# Patient Record
Sex: Female | Born: 1955 | Race: White | Hispanic: No | Marital: Married | Smoking: Never smoker
Health system: Southern US, Community
[De-identification: ages and names within clinical notes are randomized; demographics above are authoritative.]

## PROBLEM LIST (undated history)

## (undated) DIAGNOSIS — N189 Chronic kidney disease, unspecified: Secondary | ICD-10-CM

## (undated) DIAGNOSIS — E119 Type 2 diabetes mellitus without complications: Secondary | ICD-10-CM

## (undated) DIAGNOSIS — E785 Hyperlipidemia, unspecified: Secondary | ICD-10-CM

## (undated) DIAGNOSIS — B009 Herpesviral infection, unspecified: Secondary | ICD-10-CM

## (undated) DIAGNOSIS — I10 Essential (primary) hypertension: Secondary | ICD-10-CM

## (undated) HISTORY — PX: CHOLECYSTECTOMY: SHX55

## (undated) HISTORY — DX: Type 2 diabetes mellitus without complications: E11.9

## (undated) HISTORY — DX: Chronic kidney disease, unspecified: N18.9

---

## 2004-11-21 ENCOUNTER — Encounter: Admission: RE | Admit: 2004-11-21 | Discharge: 2004-11-21 | Payer: Self-pay | Admitting: Family Medicine

## 2008-01-25 ENCOUNTER — Ambulatory Visit: Payer: Self-pay | Admitting: General Practice

## 2009-01-30 ENCOUNTER — Ambulatory Visit: Payer: Self-pay | Admitting: General Practice

## 2010-01-22 ENCOUNTER — Ambulatory Visit: Payer: Self-pay | Admitting: General Practice

## 2010-02-05 ENCOUNTER — Ambulatory Visit: Payer: Self-pay | Admitting: General Practice

## 2011-01-21 ENCOUNTER — Ambulatory Visit: Payer: Self-pay | Admitting: General Practice

## 2012-01-20 ENCOUNTER — Ambulatory Visit: Payer: Self-pay

## 2013-01-26 ENCOUNTER — Ambulatory Visit: Payer: Self-pay | Admitting: General Practice

## 2013-02-07 ENCOUNTER — Ambulatory Visit (INDEPENDENT_AMBULATORY_CARE_PROVIDER_SITE_OTHER): Payer: PRIVATE HEALTH INSURANCE | Admitting: Family Medicine

## 2013-02-07 VITALS — BP 106/60 | HR 88 | Temp 98.7°F | Resp 20 | Ht 64.75 in | Wt 186.2 lb

## 2013-02-07 DIAGNOSIS — R300 Dysuria: Secondary | ICD-10-CM

## 2013-02-07 DIAGNOSIS — E119 Type 2 diabetes mellitus without complications: Secondary | ICD-10-CM

## 2013-02-07 DIAGNOSIS — E1122 Type 2 diabetes mellitus with diabetic chronic kidney disease: Secondary | ICD-10-CM | POA: Insufficient documentation

## 2013-02-07 DIAGNOSIS — R3129 Other microscopic hematuria: Secondary | ICD-10-CM

## 2013-02-07 LAB — BASIC METABOLIC PANEL
BUN: 26 mg/dL — ABNORMAL HIGH (ref 6–23)
Calcium: 10.2 mg/dL (ref 8.4–10.5)
Creat: 1.08 mg/dL (ref 0.50–1.10)
Sodium: 143 mEq/L (ref 135–145)

## 2013-02-07 LAB — POCT URINALYSIS DIPSTICK: Urobilinogen, UA: 0.2

## 2013-02-07 LAB — POCT CBC
Granulocyte percent: 71.9 %G (ref 37–80)
HCT, POC: 45.8 % (ref 37.7–47.9)
Hemoglobin: 14.3 g/dL (ref 12.2–16.2)
Lymph, poc: 1.9 (ref 0.6–3.4)
MCH, POC: 29.8 pg (ref 27–31.2)
MCV: 95.4 fL (ref 80–97)
MID (cbc): 0.4 (ref 0–0.9)
MPV: 8.6 fL (ref 0–99.8)
POC Granulocyte: 5.9 (ref 2–6.9)
POC LYMPH PERCENT: 23.7 %L (ref 10–50)
POC MID %: 4.4 %M (ref 0–12)
WBC: 8.2 10*3/uL (ref 4.6–10.2)

## 2013-02-07 LAB — POCT UA - MICROSCOPIC ONLY: Casts, Ur, LPF, POC: NEGATIVE

## 2013-02-07 LAB — POCT GLYCOSYLATED HEMOGLOBIN (HGB A1C): Hemoglobin A1C: 5.2

## 2013-02-07 MED ORDER — CEPHALEXIN 500 MG PO CAPS: 500.0000 mg | ORAL_CAPSULE | Freq: Two times a day (BID) | ORAL | Status: DC

## 2013-02-07 NOTE — Patient Instructions (Addendum)
Use azo (pyuridium) OTC for your symptoms.    I will be in touch with your urine culture results.    Use the keflex as directed for your UTI Drink plenty of fluids.

## 2013-02-07 NOTE — Progress Notes (Addendum)
[facility] [address] [place] [address] [phone]  Date:  02/07/2013   Name:  Vanessa Sandoval   DOB:  06-10-1955   MRN:  [id number]  PCP:  Karie Chimera, MD    Chief Complaint: Cystitis   History of Present Illness:  Vanessa Sandoval is a 57 y.o. very pleasant female patient who presents with the following:  She awoke with suprapubic pain this morning.  She has noted urinary frequency and urgency.   She does not have any hematuria or dysuria.   She has had a bladder infection in the past- this this may be the same.    No back pain, no fever, no vomiting.   She has not noted any vaginal symptoms.    She has IDDM, and takes zocor and ?amlodipine for BP.    NKDA.   She does not generally check her glucose.  Her labs were checked a few months ago- sometime over the summer.  Reports her A1c was in the 5s at that time  There are no active problems to display for this patient.   Past Medical History  Diagnosis Date  . Diabetes mellitus without complication     Past Surgical History  Procedure Laterality Date  . Cesarean section    . Cholecystectomy      History  Substance Use Topics  . Smoking status: Never Smoker   . Smokeless tobacco: Not on file  . Alcohol Use: No    Family History  Problem Relation Age of Onset  . Diabetes Mother   . Hyperlipidemia Mother   . Diabetes Father   . Heart disease Father   . Parkinson's disease Father   . Diabetes Brother     No Known Allergies  Medication list has been reviewed and updated.  No current outpatient prescriptions on file prior to visit.   No current facility-administered medications on file prior to visit.    Review of Systems:  As per HPI- otherwise negative. History of cholecyctectomy.  No other abdominal operations  Physical Examination: Filed Vitals:   02/07/13 1252  BP: 106/60  Pulse: 88  Temp: 98.7 F (37.1 C)  Resp: 20   Filed Vitals:   02/07/13 1252  Height:  5' 4.75" (1.645 m)  Weight: 186 lb 3.2 oz (84.46 kg)   Body mass index is 31.21 kg/(m^2). Ideal Body Weight: Weight in (lb) to have BMI = 25: 148.8  GEN: WDWN, NAD, Non-toxic, A & O x 3, overweight, looks well HEENT: Atraumatic, Normocephalic. Neck supple. No masses, No LAD. Ears and Nose: No external deformity. CV: RRR, No M/G/R. No JVD. No thrill. No extra heart sounds. PULM: CTA B, no wheezes, crackles, rhonchi. No retractions. No resp. distress. No accessory muscle use. ABD: S, ND, +BS. No rebound. No HSM.  Mild suprapubic tenderness  EXTR: No c/c/e NEURO Normal gait.  PSYCH: Normally interactive. Conversant. Not depressed or anxious appearing.  Calm demeanor.   Results for orders placed in visit on 02/07/13  POCT UA - MICROSCOPIC ONLY      Result Value Range   WBC, Ur, HPF, POC 4-8     RBC, urine, microscopic tntc     Bacteria, U Microscopic 1+     Mucus, UA small     Epithelial cells, urine per micros 3-6     Crystals, Ur, HPF, POC neg     Casts, Ur, LPF, POC neg     Yeast, UA neg  POCT URINALYSIS DIPSTICK      Result Value Range   Color, UA yellow     Clarity, UA clear     Glucose, UA neg     Bilirubin, UA moderate     Ketones, UA trace     Spec Grav, UA >=1.030     Blood, UA large     pH, UA 5.5     Protein, UA 100     Urobilinogen, UA 0.2     Nitrite, UA neg     Leukocytes, UA small (1+)    POCT CBC      Result Value Range   WBC 8.2  4.6 - 10.2 K/uL   Lymph, poc 1.9  0.6 - 3.4   POC LYMPH PERCENT 23.7  10 - 50 %L   MID (cbc) 0.4  0 - 0.9   POC MID % 4.4  0 - 12 %M   POC Granulocyte 5.9  2 - 6.9   Granulocyte percent 71.9  37 - 80 %G   RBC 4.80  4.04 - 5.48 M/uL   Hemoglobin 14.3  12.2 - 16.2 g/dL   HCT, POC 16.1  09.6 - 47.9 %   MCV 95.4  80 - 97 fL   MCH, POC 29.8  27 - 31.2 pg   MCHC 31.2 (*) 31.8 - 35.4 g/dL   RDW, POC 04.5     Platelet Count, POC 309  142 - 424 K/uL   MPV 8.6  0 - 99.8 fL  POCT GLYCOSYLATED HEMOGLOBIN (HGB A1C)      Result  Value Range   Hemoglobin A1C 5.2        Assessment and Plan: Dysuria - Plan: POCT UA - Microscopic Only, POCT urinalysis dipstick, Urine culture, cephALEXin (KEFLEX) 500 MG capsule  Type II or unspecified type diabetes mellitus without mention of complication, not stated as uncontrolled - Plan: Basic metabolic panel, POCT CBC, POCT glycosylated hemoglobin (Hb A1C)  IDDM (insulin dependent diabetes mellitus)  likely UTI.  Start keflex, await urine culture.   DM: continues to show good control.  Await BMP.   See patient instructions for more details.     Signed Abbe Amsterdam, MD  11/15: received urine culture.  Results negative.  Called but no answer, left detailed message Asked her to please give me a call if not feeing better.  Otherwise need to evaluate hematuria via urology.  I will make this referral for her.  a

## 2013-02-11 ENCOUNTER — Encounter: Payer: Self-pay | Admitting: Family Medicine

## 2013-02-11 NOTE — Addendum Note (Signed)
Addended by: Abbe Amsterdam C on: 02/11/2013 01:22 PM   Modules accepted: Orders

## 2013-05-07 ENCOUNTER — Other Ambulatory Visit: Payer: Self-pay | Admitting: Family Medicine

## 2014-01-23 ENCOUNTER — Ambulatory Visit: Payer: Self-pay | Admitting: General Practice

## 2014-12-24 ENCOUNTER — Other Ambulatory Visit: Payer: Self-pay | Admitting: Family Medicine

## 2014-12-26 LAB — CMP12+LP+TP+TSH+6AC+CBC/D/PLT
ALK PHOS: 81 IU/L (ref 39–117)
ALT: 14 IU/L (ref 0–32)
AST: 14 IU/L (ref 0–40)
Albumin/Globulin Ratio: 1.3 (ref 1.1–2.5)
Albumin: 4.3 g/dL (ref 3.5–5.5)
BASOS ABS: 0 10*3/uL (ref 0.0–0.2)
BILIRUBIN TOTAL: 0.3 mg/dL (ref 0.0–1.2)
BUN/Creatinine Ratio: 28 — ABNORMAL HIGH (ref 9–23)
BUN: 34 mg/dL — ABNORMAL HIGH (ref 6–24)
Basos: 1 %
CALCIUM: 9.9 mg/dL (ref 8.7–10.2)
CHLORIDE: 99 mmol/L (ref 97–108)
CHOL/HDL RATIO: 4.1 ratio (ref 0.0–4.4)
CHOLESTEROL TOTAL: 203 mg/dL — AB (ref 100–199)
Creatinine, Ser: 1.23 mg/dL — ABNORMAL HIGH (ref 0.57–1.00)
EOS (ABSOLUTE): 0.1 10*3/uL (ref 0.0–0.4)
Eos: 2 %
Estimated CHD Risk: 0.9 times avg. (ref 0.0–1.0)
FREE THYROXINE INDEX: 2.1 (ref 1.2–4.9)
GFR calc non Af Amer: 48 mL/min/{1.73_m2} — ABNORMAL LOW (ref >59)
GFR, EST AFRICAN AMERICAN: 56 mL/min/{1.73_m2} — AB (ref >59)
GGT: 12 IU/L (ref 0–60)
GLUCOSE: 128 mg/dL — AB (ref 65–99)
Globulin, Total: 3.2 g/dL (ref 1.5–4.5)
HDL: 50 mg/dL (ref >39)
Hematocrit: 41.4 % (ref 34.0–46.6)
Hemoglobin: 13.4 g/dL (ref 11.1–15.9)
IMMATURE GRANS (ABS): 0 10*3/uL (ref 0.0–0.1)
IMMATURE GRANULOCYTES: 0 %
Iron: 91 ug/dL (ref 27–159)
LDH: 204 IU/L (ref 119–226)
LDL CALC: 124 mg/dL — AB (ref 0–99)
LYMPHS: 38 %
Lymphocytes Absolute: 2.6 10*3/uL (ref 0.7–3.1)
MCH: 29.2 pg (ref 26.6–33.0)
MCHC: 32.4 g/dL (ref 31.5–35.7)
MCV: 90 fL (ref 79–97)
MONOCYTES: 8 %
MONOS ABS: 0.5 10*3/uL (ref 0.1–0.9)
NEUTROS ABS: 3.6 10*3/uL (ref 1.4–7.0)
NEUTROS PCT: 51 %
PHOSPHORUS: 4 mg/dL (ref 2.5–4.5)
Platelets: 309 10*3/uL (ref 150–379)
Potassium: 4 mmol/L (ref 3.5–5.2)
RBC: 4.59 x10E6/uL (ref 3.77–5.28)
RDW: 14.3 % (ref 12.3–15.4)
Sodium: 142 mmol/L (ref 134–144)
T3 Uptake Ratio: 24 % (ref 24–39)
T4 TOTAL: 8.8 ug/dL (ref 4.5–12.0)
TOTAL PROTEIN: 7.5 g/dL (ref 6.0–8.5)
TRIGLYCERIDES: 143 mg/dL (ref 0–149)
TSH: 3.19 u[IU]/mL (ref 0.450–4.500)
Uric Acid: 8.9 mg/dL — ABNORMAL HIGH (ref 2.5–7.1)
VLDL Cholesterol Cal: 29 mg/dL (ref 5–40)
WBC: 6.9 10*3/uL (ref 3.4–10.8)

## 2014-12-26 LAB — HGB A1C W/O EAG: Hgb A1c MFr Bld: 7.1 % — ABNORMAL HIGH (ref 4.8–5.6)

## 2015-01-09 ENCOUNTER — Other Ambulatory Visit: Payer: Self-pay | Admitting: Family Medicine

## 2015-01-09 DIAGNOSIS — Z1231 Encounter for screening mammogram for malignant neoplasm of breast: Secondary | ICD-10-CM

## 2015-01-15 ENCOUNTER — Ambulatory Visit
Admission: RE | Admit: 2015-01-15 | Discharge: 2015-01-15 | Disposition: A | Discharge status: Home / Self Care | Payer: No Typology Code available for payment source | Source: Ambulatory Visit | Attending: Family Medicine | Admitting: Family Medicine

## 2015-01-15 DIAGNOSIS — Z1231 Encounter for screening mammogram for malignant neoplasm of breast: Secondary | ICD-10-CM | POA: Insufficient documentation

## 2016-01-03 ENCOUNTER — Other Ambulatory Visit: Payer: Self-pay | Admitting: Family Medicine

## 2016-01-03 DIAGNOSIS — Z1231 Encounter for screening mammogram for malignant neoplasm of breast: Secondary | ICD-10-CM

## 2016-01-04 LAB — CMP12+LP+TP+TSH+6AC+CBC/D/PLT
ALBUMIN: 4.1 g/dL (ref 3.6–4.8)
ALK PHOS: 104 IU/L (ref 39–117)
ALT: 14 IU/L (ref 0–32)
AST: 17 IU/L (ref 0–40)
Albumin/Globulin Ratio: 1.3 (ref 1.2–2.2)
BUN/Creatinine Ratio: 27 (ref 12–28)
BUN: 32 mg/dL — ABNORMAL HIGH (ref 8–27)
Basophils Absolute: 0 10*3/uL (ref 0.0–0.2)
Basos: 0 %
Bilirubin Total: <0.2 mg/dL (ref 0.0–1.2)
CALCIUM: 8.9 mg/dL (ref 8.7–10.3)
CHOLESTEROL TOTAL: 193 mg/dL (ref 100–199)
Chloride: 103 mmol/L (ref 96–106)
Chol/HDL Ratio: 4.7 ratio units — ABNORMAL HIGH (ref 0.0–4.4)
Creatinine, Ser: 1.2 mg/dL — ABNORMAL HIGH (ref 0.57–1.00)
EOS (ABSOLUTE): 0.1 10*3/uL (ref 0.0–0.4)
Eos: 3 %
Estimated CHD Risk: 1.2 times avg. — ABNORMAL HIGH (ref 0.0–1.0)
FREE THYROXINE INDEX: 2 (ref 1.2–4.9)
GFR calc Af Amer: 57 mL/min/{1.73_m2} — ABNORMAL LOW (ref >59)
GFR calc non Af Amer: 49 mL/min/{1.73_m2} — ABNORMAL LOW (ref >59)
GGT: 12 IU/L (ref 0–60)
GLOBULIN, TOTAL: 3.1 g/dL (ref 1.5–4.5)
Glucose: 169 mg/dL — ABNORMAL HIGH (ref 65–99)
HDL: 41 mg/dL (ref >39)
Hematocrit: 40.4 % (ref 34.0–46.6)
Hemoglobin: 13.8 g/dL (ref 11.1–15.9)
IMMATURE GRANS (ABS): 0 10*3/uL (ref 0.0–0.1)
IMMATURE GRANULOCYTES: 0 %
Iron: 83 ug/dL (ref 27–159)
LDH: 203 IU/L (ref 119–226)
LDL CALC: 109 mg/dL — AB (ref 0–99)
LYMPHS: 41 %
Lymphocytes Absolute: 2.2 10*3/uL (ref 0.7–3.1)
MCH: 30.7 pg (ref 26.6–33.0)
MCHC: 34.2 g/dL (ref 31.5–35.7)
MCV: 90 fL (ref 79–97)
MONOS ABS: 0.5 10*3/uL (ref 0.1–0.9)
Monocytes: 9 %
NEUTROS PCT: 47 %
Neutrophils Absolute: 2.6 10*3/uL (ref 1.4–7.0)
PLATELETS: 248 10*3/uL (ref 150–379)
POTASSIUM: 3.7 mmol/L (ref 3.5–5.2)
Phosphorus: 2.6 mg/dL (ref 2.5–4.5)
RBC: 4.49 x10E6/uL (ref 3.77–5.28)
RDW: 13.6 % (ref 12.3–15.4)
Sodium: 142 mmol/L (ref 134–144)
T3 UPTAKE RATIO: 26 % (ref 24–39)
T4 TOTAL: 7.6 ug/dL (ref 4.5–12.0)
TRIGLYCERIDES: 216 mg/dL — AB (ref 0–149)
TSH: 2.6 u[IU]/mL (ref 0.450–4.500)
Total Protein: 7.2 g/dL (ref 6.0–8.5)
Uric Acid: 9.2 mg/dL — ABNORMAL HIGH (ref 2.5–7.1)
VLDL Cholesterol Cal: 43 mg/dL — ABNORMAL HIGH (ref 5–40)
WBC: 5.4 10*3/uL (ref 3.4–10.8)

## 2016-01-04 LAB — HGB A1C W/O EAG: Hgb A1c MFr Bld: 6.9 % — ABNORMAL HIGH (ref 4.8–5.6)

## 2016-01-20 ENCOUNTER — Ambulatory Visit
Admission: RE | Admit: 2016-01-20 | Discharge: 2016-01-20 | Disposition: A | Discharge status: Home / Self Care | Payer: No Typology Code available for payment source | Source: Ambulatory Visit | Attending: Family Medicine | Admitting: Family Medicine

## 2016-01-20 DIAGNOSIS — R928 Other abnormal and inconclusive findings on diagnostic imaging of breast: Secondary | ICD-10-CM | POA: Diagnosis not present

## 2016-01-20 DIAGNOSIS — Z1231 Encounter for screening mammogram for malignant neoplasm of breast: Secondary | ICD-10-CM | POA: Diagnosis not present

## 2016-01-23 ENCOUNTER — Other Ambulatory Visit: Payer: Self-pay | Admitting: Family Medicine

## 2016-01-23 DIAGNOSIS — N6310 Unspecified lump in the right breast, unspecified quadrant: Secondary | ICD-10-CM

## 2016-02-06 ENCOUNTER — Ambulatory Visit
Admission: RE | Admit: 2016-02-06 | Discharge: 2016-02-06 | Disposition: A | Discharge status: Home / Self Care | Payer: No Typology Code available for payment source | Source: Ambulatory Visit | Attending: Family Medicine | Admitting: Family Medicine

## 2016-02-06 DIAGNOSIS — N6310 Unspecified lump in the right breast, unspecified quadrant: Secondary | ICD-10-CM

## 2016-02-16 ENCOUNTER — Ambulatory Visit (HOSPITAL_COMMUNITY)
Admission: EM | Admit: 2016-02-16 | Discharge: 2016-02-16 | Disposition: A | Discharge status: Home / Self Care | Payer: No Typology Code available for payment source | Attending: Emergency Medicine | Admitting: Emergency Medicine

## 2016-02-16 ENCOUNTER — Encounter (HOSPITAL_COMMUNITY): Payer: Self-pay | Admitting: *Deleted

## 2016-02-16 DIAGNOSIS — M62830 Muscle spasm of back: Secondary | ICD-10-CM

## 2016-02-16 HISTORY — DX: Essential (primary) hypertension: I10

## 2016-02-16 HISTORY — DX: Hyperlipidemia, unspecified: E78.5

## 2016-02-16 MED ORDER — CYCLOBENZAPRINE HCL 5 MG PO TABS: 5.0000 mg | ORAL_TABLET | Freq: Three times a day (TID) | ORAL | 0 refills | Status: DC | PRN

## 2016-02-16 NOTE — ED Provider Notes (Signed)
[facility]    CSN: [id number] Arrival date & time: 02/16/16  1210     History   Chief Complaint Chief Complaint  Patient presents with  . Back Pain    HPI Vanessa Sandoval is a 60 y.o. female.   HPI She is a 60-year-old woman here for evaluation of back pain. She reports pain in the left upper back. This started on Thursday. She describes it as intermittent spasms. Movement seems to trigger them. Heat makes it better. She denies any radiating pain, numbness, or weakness. No cough or shortness of breath. Pain is not related to breathing. No fevers.  Past Medical History:  Diagnosis Date  . Diabetes mellitus without complication (HCC)   . Hyperlipemia   . Hypertension     Patient Active Problem List   Diagnosis Date Noted  . IDDM (insulin dependent diabetes mellitus) (HCC) 02/07/2013    Past Surgical History:  Procedure Laterality Date  . CESAREAN SECTION    . CHOLECYSTECTOMY      OB History    No data available       Home Medications    Prior to Admission medications   Medication Sig Start Date End Date Taking? Authorizing Provider  triamterene-hydrochlorothiazide (DYAZIDE) 37.5-25 MG capsule Take 1 capsule by mouth daily.   Yes Historical Provider, MD  cyclobenzaprine (FLEXERIL) 5 MG tablet Take 1 tablet (5 mg total) by mouth 3 (three) times daily as needed for muscle spasms. 02/16/16   Charm RingsErin J Kayla Weekes, MD  insulin glargine (LANTUS) 100 UNIT/ML injection Inject 80 Units into the skin at bedtime.     Historical Provider, MD  simvastatin (ZOCOR) 5 MG tablet Take 40 mg by mouth daily.     Historical Provider, MD    Family History Family History  Problem Relation Age of Onset  . Diabetes Mother   . Hyperlipidemia Mother   . Diabetes Father   . Heart disease Father   . Parkinson's disease Father   . Diabetes Brother   . Breast cancer Neg Hx     Social History Social History  Substance Use Topics  . Smoking status: Passive Smoke Exposure - Never  Smoker  . Smokeless tobacco: Never Used  . Alcohol use No     Allergies   Patient has no known allergies.   Review of Systems Review of Systems As in history of present illness  Physical Exam Triage Vital Signs ED Triage Vitals  Enc Vitals Group     BP 02/16/16 1304 144/82     Pulse Rate 02/16/16 1304 74     Resp 02/16/16 1304 16     Temp 02/16/16 1304 98.3 F (36.8 C)     Temp Source 02/16/16 1304 Oral     SpO2 02/16/16 1304 100 %     Weight --      Height --      Head Circumference --      Peak Flow --      Pain Score 02/16/16 1307 0     Pain Loc --      Pain Edu? --      Excl. in GC? --    No data found.   Updated Vital Signs BP 144/82 (BP Location: Left Arm)   Pulse 74   Temp 98.3 F (36.8 C) (Oral)   Resp 16   SpO2 100%   Visual Acuity Right Eye Distance:   Left Eye Distance:   Bilateral Distance:    Right Eye Near:  Left Eye Near:    Bilateral Near:     Physical Exam  Constitutional: She is oriented to person, place, and time. She appears well-developed and well-nourished. No distress.  Cardiovascular: Normal rate.   Pulmonary/Chest: Effort normal.  Musculoskeletal:  Back: No vertebral tenderness or step-offs. She appears to have a knotted muscle along the medial aspect of the left scapula. She is tender over this as well as more medially. 5 out of 5 strength in bilateral upper extremities.  Neurological: She is alert and oriented to person, place, and time.     UC Treatments / Results  Labs (all labs ordered are listed, but only abnormal results are displayed) Labs Reviewed - No data to display  EKG  EKG Interpretation None       Radiology No results found.  Procedures Procedures (including critical care time)  Medications Ordered in UC Medications - No data to display   Initial Impression / Assessment and Plan / UC Course  I have reviewed the triage vital signs and the nursing notes.  Pertinent labs & imaging results  that were available during my care of the patient were reviewed by me and considered in my medical decision making (see chart for details).  Clinical Course     History and exam consistent with muscle spasms. We'll treat with Flexeril as needed. Recommended continued heat and adding gentle massage. Follow-up as needed.  Final Clinical Impressions(s) / UC Diagnoses   Final diagnoses:  Back spasm    New Prescriptions Discharge Medication List as of 02/16/2016  2:12 PM    START taking these medications   Details  cyclobenzaprine (FLEXERIL) 5 MG tablet Take 1 tablet (5 mg total) by mouth 3 (three) times daily as needed for muscle spasms., Starting Sun 02/16/2016, Normal         Charm RingsErin J Vanessa Want, MD 02/16/16 1425

## 2016-02-16 NOTE — Discharge Instructions (Signed)
You are having muscle spasms of your back. Take Flexeril 3 times a day as needed. Do not drive while taking this medicine. Continue to apply heat. When things calm down a little bit, have a family member do massage. Follow-up as needed.

## 2016-02-16 NOTE — ED Triage Notes (Signed)
Denies any injury.  No relief with OTC meds.

## 2016-03-19 ENCOUNTER — Encounter (HOSPITAL_COMMUNITY): Payer: Self-pay | Admitting: Emergency Medicine

## 2016-03-19 ENCOUNTER — Ambulatory Visit (HOSPITAL_COMMUNITY)
Admission: EM | Admit: 2016-03-19 | Discharge: 2016-03-19 | Disposition: A | Discharge status: Home / Self Care | Payer: No Typology Code available for payment source | Attending: Emergency Medicine | Admitting: Emergency Medicine

## 2016-03-19 DIAGNOSIS — H9202 Otalgia, left ear: Secondary | ICD-10-CM | POA: Diagnosis not present

## 2016-03-19 DIAGNOSIS — J069 Acute upper respiratory infection, unspecified: Secondary | ICD-10-CM | POA: Diagnosis not present

## 2016-03-19 DIAGNOSIS — H66012 Acute suppurative otitis media with spontaneous rupture of ear drum, left ear: Secondary | ICD-10-CM

## 2016-03-19 DIAGNOSIS — B9789 Other viral agents as the cause of diseases classified elsewhere: Secondary | ICD-10-CM | POA: Diagnosis not present

## 2016-03-19 MED ORDER — AMOXICILLIN 500 MG PO CAPS: 1000.0000 mg | ORAL_CAPSULE | Freq: Two times a day (BID) | ORAL | 0 refills | Status: DC

## 2016-03-19 NOTE — ED Provider Notes (Signed)
CSN: [id number]     Arrival date & time 03/19/16  1010 History   First MD Initiated Contact with Patient 03/19/16 1114     Chief Complaint  Patient presents with  . Otalgia   (Consider location/radiation/quality/duration/timing/severity/associated sxs/prior Treatment) 60 year old female complaining of decreased hearing in the left ear. She initially developed pain yesterday afternoon and then yesterday evening she noticed some clear drainage from the left ear canal. The pain was partially relieved. The ear feels better now. She has had some mild upper respiratory congestion over the past several days.      Past Medical History:  Diagnosis Date  . Diabetes mellitus without complication (HCC)   . Hyperlipemia   . Hypertension    Past Surgical History:  Procedure Laterality Date  . CESAREAN SECTION    . CHOLECYSTECTOMY     Family History  Problem Relation Age of Onset  . Diabetes Mother   . Hyperlipidemia Mother   . Diabetes Father   . Heart disease Father   . Parkinson's disease Father   . Diabetes Brother   . Breast cancer Neg Hx    Social History  Substance Use Topics  . Smoking status: Passive Smoke Exposure - Never Smoker  . Smokeless tobacco: Never Used  . Alcohol use No   OB History    No data available     Review of Systems  Constitutional: Negative for activity change, appetite change, chills, fatigue and fever.  HENT: Positive for congestion, ear pain, postnasal drip and rhinorrhea. Negative for facial swelling.   Eyes: Negative.   Respiratory: Negative.   Cardiovascular: Negative.   Musculoskeletal: Negative for neck pain and neck stiffness.  Skin: Negative for pallor and rash.  Neurological: Negative.   All other systems reviewed and are negative.   Allergies  Patient has no known allergies.  Home Medications   Prior to Admission medications   Medication Sig Start Date End Date Taking? Authorizing Provider  amoxicillin (AMOXIL) 500 MG capsule  Take 2 capsules (1,000 mg total) by mouth 2 (two) times daily. 03/19/16   Hayden Rasmussenavid Taiga Lupinacci, NP  cyclobenzaprine (FLEXERIL) 5 MG tablet Take 1 tablet (5 mg total) by mouth 3 (three) times daily as needed for muscle spasms. 02/16/16   Charm RingsErin J Honig, MD  insulin glargine (LANTUS) 100 UNIT/ML injection Inject 80 Units into the skin at bedtime.     Historical Provider, MD  simvastatin (ZOCOR) 5 MG tablet Take 40 mg by mouth daily.     Historical Provider, MD  triamterene-hydrochlorothiazide (DYAZIDE) 37.5-25 MG capsule Take 1 capsule by mouth daily.    Historical Provider, MD   Meds Ordered and Administered this Visit  Medications - No data to display  BP 125/60 (BP Location: Left Arm)   Pulse 81   Temp 98.7 F (37.1 C) (Oral)   Resp 18   SpO2 96%  No data found.   Physical Exam  Constitutional: She is oriented to person, place, and time. She appears well-developed and well-nourished. No distress.  HENT:  Head: Normocephalic and atraumatic.  Mouth/Throat: No oropharyngeal exudate.  Right TM retracted with minor erythema. Left TM deeply erythematous with a large tense bulge in the lower half. Currently no fluid or noticed drainage in the EAC. No blood.  Oropharynx with minor erythema and clear PND.  Neck: Normal range of motion. Neck supple.  Cardiovascular: Normal rate and regular rhythm.   Pulmonary/Chest: Effort normal and breath sounds normal. No respiratory distress. She has no wheezes. She has  no rales.  Musculoskeletal: Normal range of motion. She exhibits no edema.  Lymphadenopathy:    She has no cervical adenopathy.  Neurological: She is alert and oriented to person, place, and time.  Skin: Skin is warm and dry. No rash noted.  Psychiatric: She has a normal mood and affect.  Nursing note and vitals reviewed.   Urgent Care Course   Clinical Course     Procedures (including critical care time)  Labs Review Labs Reviewed - No data to display  Imaging Review No results  found.   Visual Acuity Review  Right Eye Distance:   Left Eye Distance:   Bilateral Distance:    Right Eye Near:   Left Eye Near:    Bilateral Near:         MDM   1. Acute suppurative otitis media of left ear with spontaneous rupture of tympanic membrane, recurrence not specified   2. Otalgia of left ear   3. Viral upper respiratory tract infection    Decongestants as directed. He should drink any fluids stay well-hydrated. May also want to take an antihistamine such as Claritin to help with drainage and runny nose. Antibiotics hysterectomy. For increased pain or worsening of your ear over the next few days despite being on antibiotics follow-up with your primary care doctor. Meds ordered this encounter  Medications  . amoxicillin (AMOXIL) 500 MG capsule    Sig: Take 2 capsules (1,000 mg total) by mouth 2 (two) times daily.    Dispense:  40 capsule    Refill:  0    Order Specific Question:   Supervising Provider    Answer:   Charm RingsHONIG, ERIN J [[id number]]       Hayden Rasmussenavid Aeralyn Barna, NP 03/19/16 1133

## 2016-03-19 NOTE — ED Triage Notes (Signed)
Reports upper respiratory symptoms for a week.  Last night left ear had pain, draining liquid and cannot hear in this ear.  Patient says it was clear liquid drainage from left ear.

## 2016-03-19 NOTE — Discharge Instructions (Signed)
Decongestants as directed. He should drink any fluids stay well-hydrated. May also want to take an antihistamine such as Claritin to help with drainage and runny nose. Antibiotics hysterectomy. For increased pain or worsening of your ear over the next few days despite being on antibiotics follow-up with your primary care doctor.

## 2016-08-19 ENCOUNTER — Encounter (HOSPITAL_COMMUNITY): Payer: Self-pay | Admitting: Emergency Medicine

## 2016-08-19 ENCOUNTER — Ambulatory Visit (HOSPITAL_COMMUNITY)
Admission: EM | Admit: 2016-08-19 | Discharge: 2016-08-19 | Disposition: A | Discharge status: Home / Self Care | Payer: No Typology Code available for payment source | Attending: Family Medicine | Admitting: Family Medicine

## 2016-08-19 DIAGNOSIS — B373 Candidiasis of vulva and vagina: Secondary | ICD-10-CM

## 2016-08-19 DIAGNOSIS — N898 Other specified noninflammatory disorders of vagina: Secondary | ICD-10-CM | POA: Diagnosis not present

## 2016-08-19 DIAGNOSIS — B3731 Acute candidiasis of vulva and vagina: Secondary | ICD-10-CM

## 2016-08-19 HISTORY — DX: Herpesviral infection, unspecified: B00.9

## 2016-08-19 MED ORDER — FLUCONAZOLE 150 MG PO TABS: 150.0000 mg | ORAL_TABLET | Freq: Every day | ORAL | 1 refills | Status: DC

## 2016-08-19 NOTE — Medical Student Note (Signed)
[facility] [employer] account manager Note For educational purposes for Medical, PA and NP students only and not part of the legal medical record.   CSN: [id number] Arrival date & time: 08/19/16  1624     History   Chief Complaint Chief Complaint  Patient presents with  . Vaginal Itching    HPI Vanessa Sandoval is a 61 y.o. female.  Pt is a 61 year old female that presents to the [facility] with vaginal itching x 3 weeks. Denies any specific discharge. Denies bleeding. Denies abd pain or pelvic pain. Denies urinary symptoms. sts that she has been using monistat with some relief.    The history is provided by the patient.  Vaginal Itching  This is a new problem. The current episode started more than 1 week ago. The problem occurs daily. The problem has not changed since onset.Nothing aggravates the symptoms. The symptoms are relieved by medications. Treatments tried: using monistsat with relief during the day.    Past Medical History:  Diagnosis Date  . Diabetes mellitus without complication (HCC)   . Herpes   . Hyperlipemia   . Hypertension     Patient Active Problem List   Diagnosis Date Noted  . IDDM (insulin dependent diabetes mellitus) (HCC) 02/07/2013    Past Surgical History:  Procedure Laterality Date  . CESAREAN SECTION    . CHOLECYSTECTOMY      OB History    No data available       Home Medications    Prior to Admission medications   Medication Sig Start Date End Date Taking? Authorizing Provider  insulin glargine (LANTUS) 100 UNIT/ML injection Inject 80 Units into the skin at bedtime.    Yes [provider]  simvastatin (ZOCOR) 5 MG tablet Take 40 mg by mouth daily.    Yes [provider]  triamterene-hydrochlorothiazide (DYAZIDE) 37.5-25 MG capsule Take 1 capsule by mouth daily.   Yes [provider]  amoxicillin (AMOXIL) 500 MG capsule Take 2 capsules (1,000 mg total) by mouth 2 (two) times daily. 03/19/16   Hayden Rasmussen, NP    cyclobenzaprine (FLEXERIL) 5 MG tablet Take 1 tablet (5 mg total) by mouth 3 (three) times daily as needed for muscle spasms. 02/16/16   Charm Rings, MD  fluconazole (DIFLUCAN) 150 MG tablet Take 1 tablet (150 mg total) by mouth daily. 08/19/16   Deatra Canter, FNP    Family History Family History  Problem Relation Age of Onset  . Diabetes Mother   . Hyperlipidemia Mother   . Diabetes Father   . Heart disease Father   . Parkinson's disease Father   . Diabetes Brother   . Breast cancer Neg Hx     Social History Social History  Substance Use Topics  . Smoking status: Passive Smoke Exposure - Never Smoker  . Smokeless tobacco: Never Used  . Alcohol use No     Allergies   Patient has no known allergies.   Review of Systems Review of Systems  Constitutional: Negative.   HENT: Negative.   Eyes: Negative.   Respiratory: Negative.   Cardiovascular: Negative.   Gastrointestinal: Negative.   Endocrine: Negative.   Genitourinary: Negative.  Negative for decreased urine volume, difficulty urinating, dysuria, frequency, genital sores, hematuria, pelvic pain, urgency, vaginal bleeding, vaginal discharge and vaginal pain.  Musculoskeletal: Negative.   Skin: Negative.   Allergic/Immunologic: Negative.   Neurological: Negative.   Hematological: Negative.   Psychiatric/Behavioral: Negative.      Physical  Exam Updated Vital Signs BP 133/63 (BP Location: Left Arm)   Pulse 90   Temp 98.5 F (36.9 C) (Oral)   SpO2 97%   Physical Exam  Constitutional: She is oriented to person, place, and time. She appears well-developed and well-nourished.  HENT:  Head: Normocephalic and atraumatic.  Eyes: Conjunctivae and EOM are normal. Pupils are equal, round, and reactive to light.  Neck: Normal range of motion. Neck supple.  Cardiovascular: Normal rate and regular rhythm.   Pulmonary/Chest: Effort normal and breath sounds normal.  Abdominal: Soft. Bowel sounds are normal.   Genitourinary: Vagina normal.  Musculoskeletal: Normal range of motion.  Neurological: She is alert and oriented to person, place, and time.  Skin: Skin is warm and dry. Capillary refill takes less than 2 seconds.  Psychiatric: She has a normal mood and affect.     ED Treatments / Results  Labs (all labs ordered are listed, but only abnormal results are displayed) Labs Reviewed - No data to display  EKG  EKG Interpretation None       Radiology No results found.  Procedures Procedures (including critical care time)  Medications Ordered in ED Medications - No data to display   Initial Impression / Assessment and Plan / ED Course  I have reviewed the triage vital signs and the nursing notes.  Pertinent labs & imaging results that were available during my care of the patient were reviewed by me and considered in my medical decision making (see chart for details).       Final Clinical Impressions(s) / ED Diagnoses   Final diagnoses:  Vaginal candidiasis    New Prescriptions New Prescriptions   FLUCONAZOLE (DIFLUCAN) 150 MG TABLET    Take 1 tablet (150 mg total) by mouth daily.

## 2016-08-19 NOTE — ED Provider Notes (Signed)
CSN: [id number]     Arrival date & time 08/19/16  1624 History   None    Chief Complaint  Patient presents with  . Vaginal Itching   (Consider location/radiation/quality/duration/timing/severity/associated sxs/prior Treatment)  Vaginal Itching     Past Medical History:  Diagnosis Date  . Diabetes mellitus without complication (HCC)   . Herpes   . Hyperlipemia   . Hypertension    Past Surgical History:  Procedure Laterality Date  . CESAREAN SECTION    . CHOLECYSTECTOMY     Family History  Problem Relation Age of Onset  . Diabetes Mother   . Hyperlipidemia Mother   . Diabetes Father   . Heart disease Father   . Parkinson's disease Father   . Diabetes Brother   . Breast cancer Neg Hx    Social History  Substance Use Topics  . Smoking status: Passive Smoke Exposure - Never Smoker  . Smokeless tobacco: Never Used  . Alcohol use No   OB History    No data available     Review of Systems  Constitutional: Negative.   HENT: Negative.   Eyes: Negative.   Respiratory: Negative.   Cardiovascular: Negative.   Gastrointestinal: Negative.   Endocrine: Negative.   Genitourinary: Negative.   Musculoskeletal: Negative.   Allergic/Immunologic: Negative.   Neurological: Negative.   Hematological: Negative.   Psychiatric/Behavioral: Negative.     Allergies  Patient has no known allergies.  Home Medications   Prior to Admission medications   Medication Sig Start Date End Date Taking? Authorizing Provider  insulin glargine (LANTUS) 100 UNIT/ML injection Inject 80 Units into the skin at bedtime.    Yes [provider]  simvastatin (ZOCOR) 5 MG tablet Take 40 mg by mouth daily.    Yes [provider]  triamterene-hydrochlorothiazide (DYAZIDE) 37.5-25 MG capsule Take 1 capsule by mouth daily.   Yes [provider]  amoxicillin (AMOXIL) 500 MG capsule Take 2 capsules (1,000 mg total) by mouth 2 (two) times daily. 03/19/16   Hayden RasmussenMabe, David, NP   cyclobenzaprine (FLEXERIL) 5 MG tablet Take 1 tablet (5 mg total) by mouth 3 (three) times daily as needed for muscle spasms. 02/16/16   Charm RingsHonig, Erin J, MD  fluconazole (DIFLUCAN) 150 MG tablet Take 1 tablet (150 mg total) by mouth daily. 08/19/16   Deatra Canterxford, William J, FNP   Meds Ordered and Administered this Visit  Medications - No data to display  BP 133/63 (BP Location: Left Arm)   Pulse 90   Temp 98.5 F (36.9 C) (Oral)   SpO2 97%  No data found.   Physical Exam  Constitutional: She is oriented to person, place, and time. She appears well-developed and well-nourished.  HENT:  Head: Normocephalic and atraumatic.  Right Ear: External ear normal.  Left Ear: External ear normal.  Mouth/Throat: Oropharynx is clear and moist.  Eyes: Conjunctivae and EOM are normal. Pupils are equal, round, and reactive to light.  Neck: Normal range of motion. Neck supple.  Cardiovascular: Normal rate, regular rhythm and normal heart sounds.   Pulmonary/Chest: Effort normal and breath sounds normal.  Abdominal: Soft. Bowel sounds are normal.  Neurological: She is alert and oriented to person, place, and time.  Nursing note and vitals reviewed.   Urgent Care Course     Procedures (including critical care time)  Labs Review Labs Reviewed - No data to display  Imaging Review No results found.   Visual Acuity Review  Right Eye Distance:   Left Eye Distance:  Bilateral Distance:    Right Eye Near:   Left Eye Near:    Bilateral Near:         MDM   1. Vaginal candidiasis    Diflucan 150mg  one po qd x 2d #2 w/1rf    Deatra Canter, FNP 08/19/16 1731

## 2016-08-19 NOTE — ED Triage Notes (Addendum)
Pt reports vaginal itching for the last three weeks.  She uses Monestat with some relief, but she has the most itching either late at night or when waking up.  She denies any discharge or any other symptoms.  Pt reports a recent one sore herpes breakout.  She states the sore has gone away but the itching has not.

## 2017-01-05 ENCOUNTER — Ambulatory Visit: Payer: Self-pay | Admitting: *Deleted

## 2017-01-05 VITALS — BP 124/89 | Ht 65.5 in | Wt 173.0 lb

## 2017-01-05 DIAGNOSIS — Z0000 Encounter for general adult medical examination without abnormal findings: Secondary | ICD-10-CM

## 2017-01-05 NOTE — Progress Notes (Signed)
Be Well insurance premium discount evaluation: Labs Drawn. Replacements ROI form signed. Tobacco Free Attestation form signed.  Forms placed in paper chart. Okay to route results to pcp. 

## 2017-01-06 LAB — CMP12+LP+TP+TSH+6AC+CBC/D/PLT
A/G RATIO: 1.5 (ref 1.2–2.2)
ALT: 13 IU/L (ref 0–32)
AST: 13 IU/L (ref 0–40)
Albumin: 4.7 g/dL (ref 3.6–4.8)
Alkaline Phosphatase: 79 IU/L (ref 39–117)
BASOS ABS: 0 10*3/uL (ref 0.0–0.2)
BUN / CREAT RATIO: 23 (ref 12–28)
BUN: 35 mg/dL — AB (ref 8–27)
Basos: 1 %
Bilirubin Total: 0.3 mg/dL (ref 0.0–1.2)
CHOL/HDL RATIO: 4.5 ratio — AB (ref 0.0–4.4)
CREATININE: 1.52 mg/dL — AB (ref 0.57–1.00)
Calcium: 9.9 mg/dL (ref 8.7–10.3)
Chloride: 101 mmol/L (ref 96–106)
Cholesterol, Total: 226 mg/dL — ABNORMAL HIGH (ref 100–199)
EOS (ABSOLUTE): 0.1 10*3/uL (ref 0.0–0.4)
EOS: 1 %
ESTIMATED CHD RISK: 1.1 times avg. — AB (ref 0.0–1.0)
Free Thyroxine Index: 2 (ref 1.2–4.9)
GFR calc Af Amer: 42 mL/min/{1.73_m2} — ABNORMAL LOW (ref >59)
GFR, EST NON AFRICAN AMERICAN: 37 mL/min/{1.73_m2} — AB (ref >59)
GGT: 15 IU/L (ref 0–60)
GLOBULIN, TOTAL: 3.1 g/dL (ref 1.5–4.5)
Glucose: 152 mg/dL — ABNORMAL HIGH (ref 65–99)
HDL: 50 mg/dL (ref >39)
HEMATOCRIT: 42.6 % (ref 34.0–46.6)
HEMOGLOBIN: 14.9 g/dL (ref 11.1–15.9)
IRON: 103 ug/dL (ref 27–139)
Immature Grans (Abs): 0 10*3/uL (ref 0.0–0.1)
Immature Granulocytes: 0 %
LDH: 182 IU/L (ref 119–226)
LDL CALC: 133 mg/dL — AB (ref 0–99)
LYMPHS ABS: 2.3 10*3/uL (ref 0.7–3.1)
Lymphs: 45 %
MCH: 32.3 pg (ref 26.6–33.0)
MCHC: 35 g/dL (ref 31.5–35.7)
MCV: 92 fL (ref 79–97)
Monocytes Absolute: 0.3 10*3/uL (ref 0.1–0.9)
Monocytes: 6 %
NEUTROS ABS: 2.4 10*3/uL (ref 1.4–7.0)
Neutrophils: 47 %
PHOSPHORUS: 3.6 mg/dL (ref 2.5–4.5)
PLATELETS: 245 10*3/uL (ref 150–379)
POTASSIUM: 3.7 mmol/L (ref 3.5–5.2)
RBC: 4.62 x10E6/uL (ref 3.77–5.28)
RDW: 13.5 % (ref 12.3–15.4)
SODIUM: 143 mmol/L (ref 134–144)
T3 UPTAKE RATIO: 26 % (ref 24–39)
T4, Total: 7.5 ug/dL (ref 4.5–12.0)
TOTAL PROTEIN: 7.8 g/dL (ref 6.0–8.5)
TSH: 2.57 u[IU]/mL (ref 0.450–4.500)
Triglycerides: 216 mg/dL — ABNORMAL HIGH (ref 0–149)
URIC ACID: 10.8 mg/dL — AB (ref 2.5–7.1)
VLDL CHOLESTEROL CAL: 43 mg/dL — AB (ref 5–40)
WBC: 5.2 10*3/uL (ref 3.4–10.8)

## 2017-01-06 LAB — HGB A1C W/O EAG: HEMOGLOBIN A1C: 8.4 % — AB (ref 4.8–5.6)

## 2017-01-08 NOTE — Progress Notes (Signed)
Results reviewed with pt. She reports seeing pcp 2 months ago for yeast infection. Glucose and A1c were found to be 555 and 14, respectively. Was started on Trulicity once weekly and low carb diet. A1c and glucose greatly improved from 2 months ago at this point. Kidney functions, she believes are improved as well. Does not recall exact values and unable to view in epic, but her MD said the elevation was r/t the DM2 and hopefully would return to her baseline once sugars are under control. Lipids also elevated, worse than previous. Uric acid worsened from previous. Pt reports being on a short term course of something for gout previously, has not had any flares before. Will speak to her pcp about plan for this, possibly restarting medication. Unable to print gout diet handout due to power outage. Instructed pt to visit [facility] website for low purine diet guidelines. She verbalizes understanding.  With A1c and LDL over set limitations for Be Well program, pt fails 2/3 values. Pt to meet with RN once weekly x4 weeks to review cbg log book from home, food diary, and recheck BP. Alternative form signed by pt and placed in paper chart. Routine f/u with pcp. Copy of labs unable to be provided to pt 2/2 power outage. Pt can print from MyChart though if needed. Results routed to pcp per pt request. No further questions/concerns.

## 2017-01-15 ENCOUNTER — Ambulatory Visit: Payer: Self-pay | Admitting: *Deleted

## 2017-01-15 VITALS — BP 122/78

## 2017-01-15 DIAGNOSIS — E118 Type 2 diabetes mellitus with unspecified complications: Secondary | ICD-10-CM

## 2017-01-15 NOTE — Progress Notes (Signed)
Week 1 of 4: Alternative for Be Well program. Discussed diet r/t DM2. Sts typical day is no breakfast, only coffee with small amt creamer. Lunch is an apple and cheese. Dinner largest meal, varies-lean protein and veggie side. Sts no rice, limits bread and pasta. Trulicity curbs her appetite, so only light meals when she feels hungry. Discussed carb counting and plenty of unrpocessed, fresh foods. Brought cbg log with her. Past week running between 130-170 in am. Checks once daily. Reports eyesight has improved since starting Trulicity and seeing blood sugars lower. Was having blurry vision, now resolved. Gout level was increased. Reviewed high purine food/gout diet. Was eating liver pudding often- has severely cut down on this.

## 2017-01-22 ENCOUNTER — Ambulatory Visit: Payer: Self-pay | Admitting: *Deleted

## 2017-01-22 VITALS — BP 136/88 | HR 91

## 2017-01-25 NOTE — Progress Notes (Signed)
Late entry: 01/22/17 1400: Pt presents for week 2 of 4 Be Well alternative f/u appt. Pt reports blood sugar average per her meter over past week was 169. She is happy with this as she was in the 300-400s a few months ago.  Discussed exercise habits this week. She reports walking a lot during work day. Does not wear a pedometer or smart watch that tracks steps. Discussed she could find a low cost option and start tracking her steps during day. Also agreeable to starting to walk more often after work or on lunch break. She reports she used to do this and would like to restart. Reminded her that 13850min/week of moderate intensity exercise is recommended and that exercise, weight loss and good DM2 management are all closely related. She verbalizes understanding and agrees to start including more walking in her days as an introductory low impact exercise.

## 2017-01-29 ENCOUNTER — Ambulatory Visit: Payer: Self-pay | Admitting: *Deleted

## 2017-01-29 VITALS — BP 125/88 | HR 71

## 2017-01-29 DIAGNOSIS — E118 Type 2 diabetes mellitus with unspecified complications: Secondary | ICD-10-CM

## 2017-01-29 NOTE — Progress Notes (Signed)
Pt presents for Week 3 of 4 Be Well alternative.  Discussed diet and exercise changes necessary for DM2 management in last 2 weeks. Pt takes her medications daily as prescribed. Avg CBG this week 171 per her meter. Has f/u scheduled with pcp 03/01/17. A1c and glucoses greatly improved since August 2018 when A1c was 14 and glucose 555 by pt's report. Final week of alternative cancelled since BP and CBGs have been controlled over past 3 weeks and pcp f/u is scheduled. Pt to contact RN if needed and will with any medication changes at pcp appt. No further questions/concerns.

## 2017-06-25 ENCOUNTER — Encounter: Payer: Self-pay | Admitting: Podiatry

## 2017-06-25 ENCOUNTER — Ambulatory Visit (INDEPENDENT_AMBULATORY_CARE_PROVIDER_SITE_OTHER): Payer: PRIVATE HEALTH INSURANCE

## 2017-06-25 ENCOUNTER — Ambulatory Visit: Payer: PRIVATE HEALTH INSURANCE | Admitting: Podiatry

## 2017-06-25 VITALS — BP 142/91 | HR 83 | Resp 16

## 2017-06-25 DIAGNOSIS — M779 Enthesopathy, unspecified: Secondary | ICD-10-CM

## 2017-06-25 DIAGNOSIS — M778 Other enthesopathies, not elsewhere classified: Secondary | ICD-10-CM

## 2017-06-25 DIAGNOSIS — M7750 Other enthesopathy of unspecified foot: Secondary | ICD-10-CM | POA: Diagnosis not present

## 2017-06-25 MED ORDER — TRIAMCINOLONE ACETONIDE 10 MG/ML IJ SUSP
10.0000 mg | Freq: Once | INTRAMUSCULAR | Status: AC
  Administered 2017-06-25: 10 mg

## 2017-06-25 NOTE — Progress Notes (Signed)
Subjective:   Patient ID: Vanessa Sandoval, female   DOB: 62 y.o.   MRN: [id number]   HPI Patient presents with both feet hurting with the right foot hurting the midfoot on the outside and the left foot hurting under the fifth metatarsal.  States is been going on for several months and somewhat started the same time and patient states her diabetes is been under reasonably good control and she does not smoke currently and likes to be active   Review of Systems  All other systems reviewed and are negative.       Objective:  Physical Exam  Constitutional: She appears well-developed and well-nourished.  Cardiovascular: Intact distal pulses.  Pulmonary/Chest: Effort normal.  Musculoskeletal: Normal range of motion.  Neurological: She is alert.  Skin: Skin is warm.  Nursing note and vitals reviewed.   Neurovascular status found to be intact muscle strength is adequate range of motion within normal limits with patient noted to have exquisite discomfort base of fifth metatarsal right with inflammation and no indication of tendon dysfunction damage and is noted on the left have keratotic lesion sub-fifth metatarsal with fluid buildup in the plantar capsule.  Patient has good digital perfusion well oriented x3     Assessment:  Peroneal tendinitis right with inflammation fluid at the insertion and fifth MPJ capsulitis left with keratotic lesion formation     Plan:  H&P conditions reviewed and for the right I went ahead and did peritoneal injection 3 mg Kenalog 5 mg Xylocaine and advised on ice therapy and dispense fascial brace to lift up the lateral side of the foot.  The left foot I injected the capsule 3 mg dexamethasone Kenalog 5 mg Xylocaine and debrided the lesion and advised on reduced activity.  Reappoint to recheck  X-rays indicate that there is reactivity base of fifth metatarsal right with no other advanced pathology noted

## 2017-07-12 ENCOUNTER — Ambulatory Visit: Payer: PRIVATE HEALTH INSURANCE | Admitting: Podiatry

## 2017-07-12 ENCOUNTER — Encounter: Payer: Self-pay | Admitting: Podiatry

## 2017-07-12 DIAGNOSIS — M779 Enthesopathy, unspecified: Secondary | ICD-10-CM

## 2017-07-14 NOTE — Progress Notes (Signed)
Subjective:   Patient ID: Vanessa Sandoval, female   DOB: 62 y.o.   MRN: [id number]   HPI Patient states having a significant reduction of the pain in her right foot with minimal pain upon palpation   ROS      Objective:  Physical Exam  Neurovascular status intact muscle strength adequate patient found to have significant reduction of discomfort on the medial side and lateral side of the right tendon complex     Assessment:  Improved tendinitis     Plan:  Advised on anti-inflammatories ice therapy supportive shoes and will be seen back if symptoms were to persist or get worse

## 2017-08-16 IMAGING — US US BREAST*R* LIMITED INC AXILLA
1 series · 8 of 8 positions shown · non-contrast
Comparison: Previous exam(s).

CLINICAL DATA: Callback from screening mammogram for possible mass

EXAM:
2D DIGITAL DIAGNOSTIC RIGHT MAMMOGRAM WITH CAD AND ADJUNCT TOMO
ULTRASOUND RIGHT BREAST

[Series 1: us breast*right* limited inc axilla · 0.07mm/px · 8 of 8 slices shown]
[im 1/8]
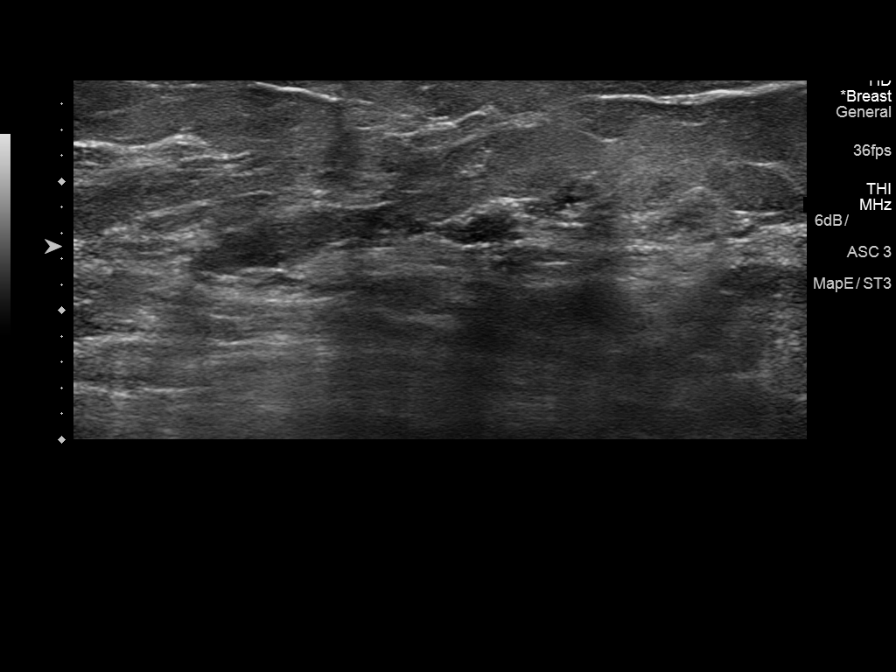
[im 2/8]
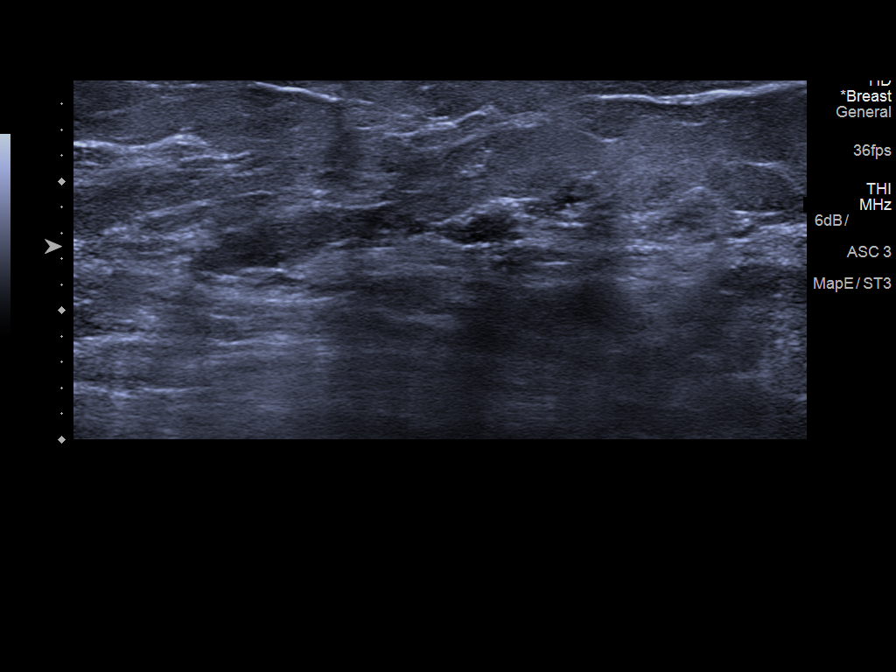
[im 3/8]
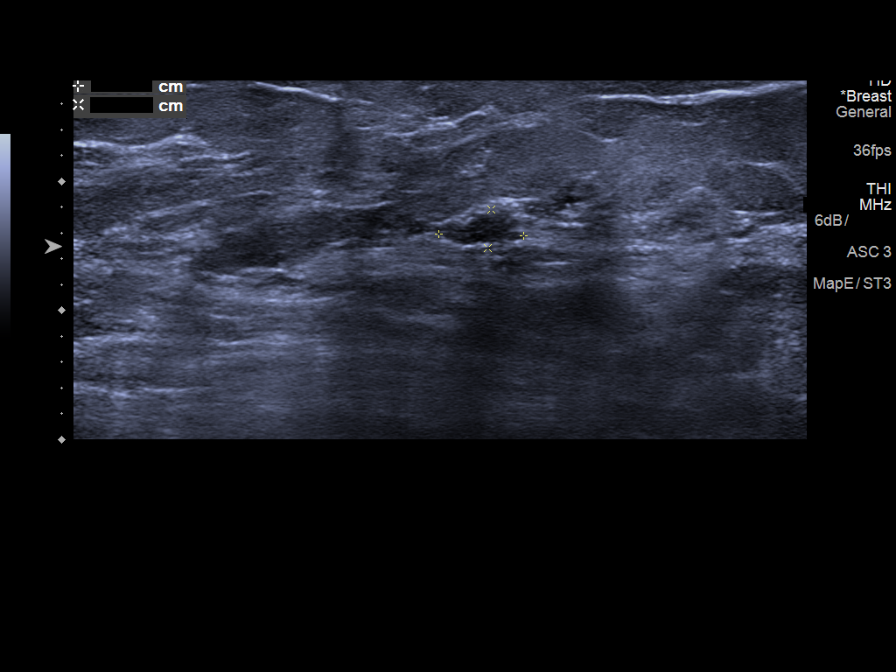
[im 4/8]
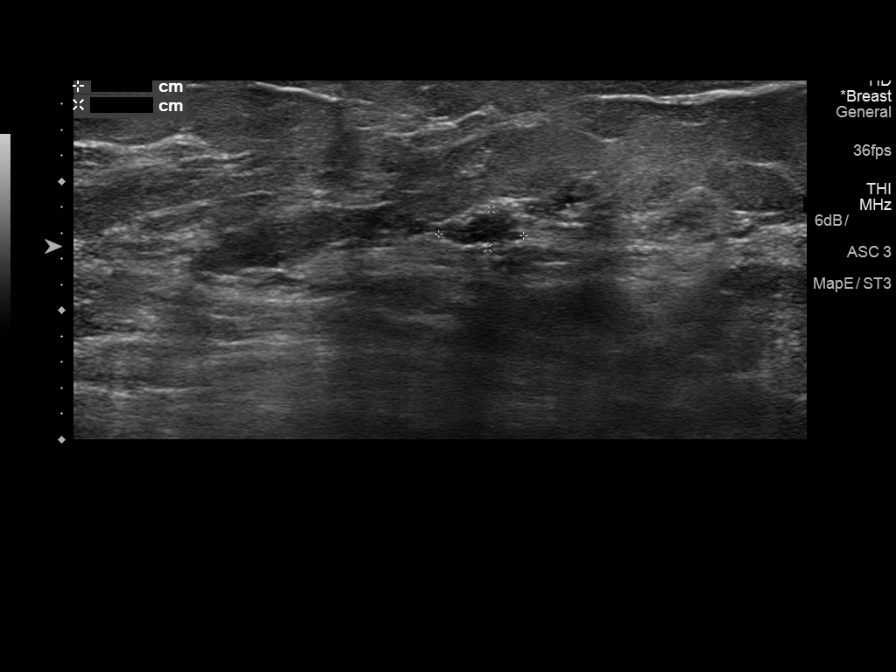
[im 5/8]
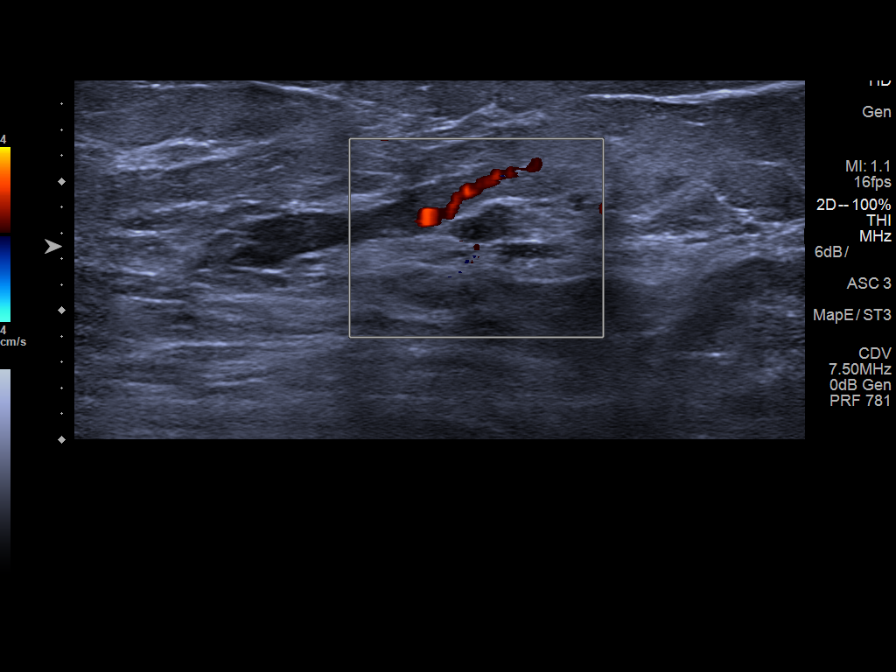
[im 6/8]
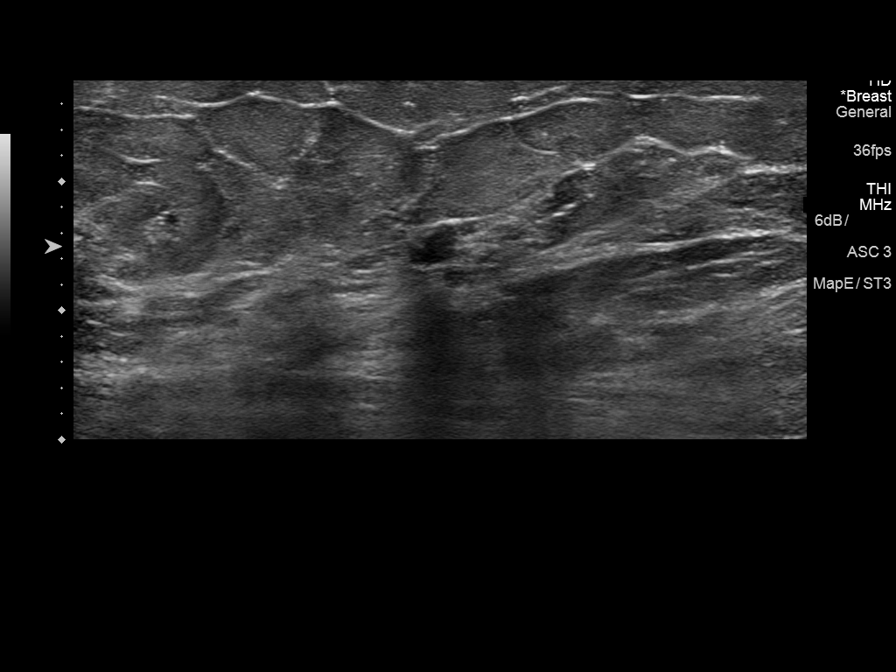
[im 7/8]
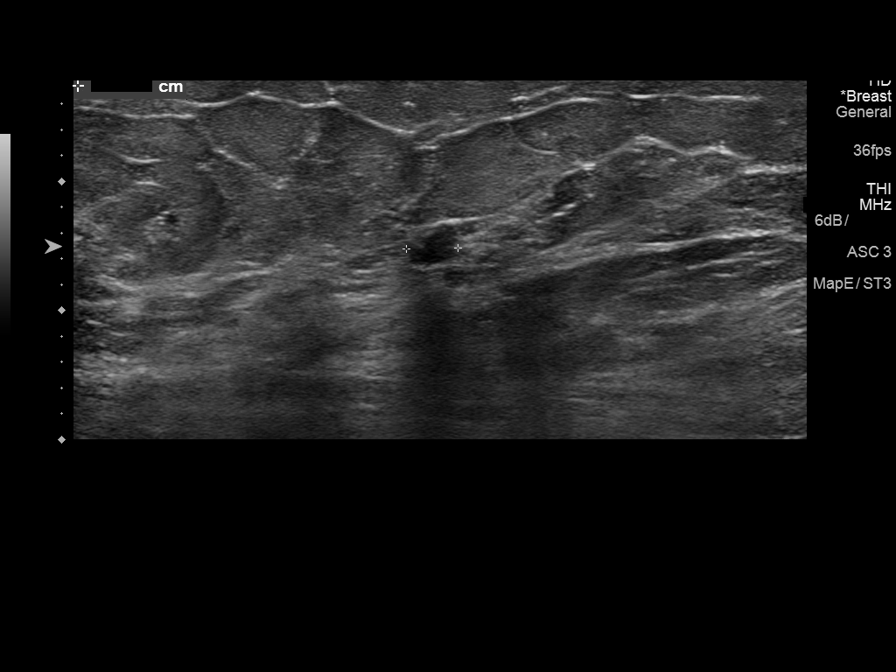
[im 8/8]
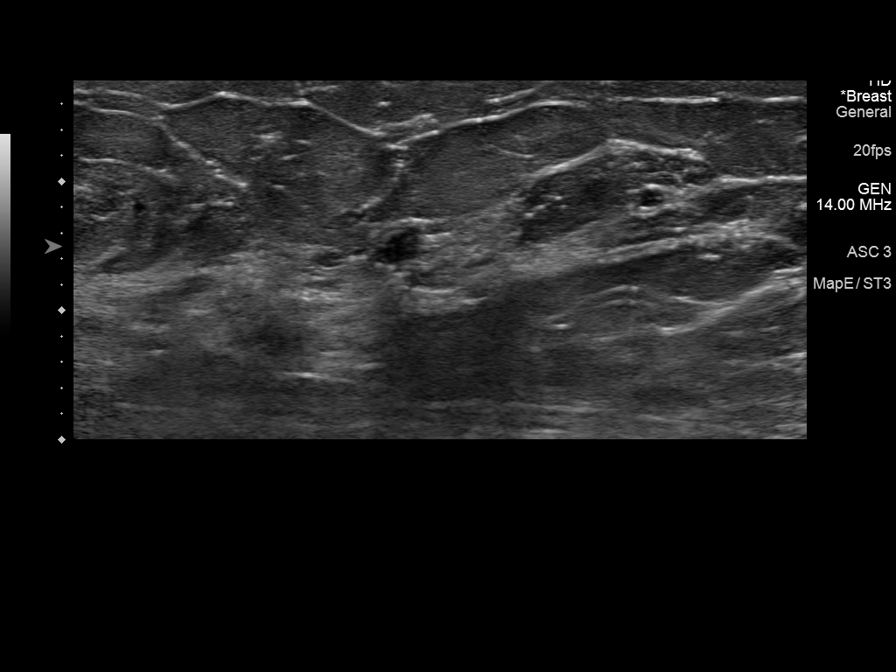

[8 of 8 positions shown; findings below may reference images not displayed]

ACR Breast Density Category c: The breast tissue is heterogeneously
dense, which may obscure small masses.
FINDINGS: Cc and MLO spot-compression views of the right breast were performed
with tomosynthesis. On the additional views, there is a 6 mm oval,
circumscribed, low-density mass within the slightly outer, slightly
lower right breast, corresponding to the finding seen on screening
mammogram. In retrospect, this mass was likely present on the 2753
exam and not significantly changed in size.

Mammographic images were processed with CAD.

On physical exam, no discrete mass is felt in the area of concern
within the lower, outer left breast.

Targeted ultrasound is performed, showing multiple areas of cystic
change within the area of concern in the lateral right breast
including a 7 x 3 x 4 mm oval, circumscribed, hypoechoic area at 8
o'clock, 3 cm from the nipple, which may correspond to the mass seen
mammographically. No suspicious cystic or solid sonographic finding
was identified in the area of concern in the lateral right breast.
IMPRESSION: Probably benign right breast mass.

RECOMMENDATION:
Right diagnostic mammogram and possible ultrasound in 6 months.
Options including short-term follow-up versus tomosynthesis guided
biopsy were discussed with the patient who wishes to pursue
short-term follow-up at this time.

I have discussed the findings and recommendations with the patient.
Results were also provided in writing at the conclusion of the
visit. If applicable, a reminder letter will be sent to the patient
regarding the next appointment.

BI-RADS CATEGORY  3: Probably benign.

## 2017-08-31 ENCOUNTER — Emergency Department (HOSPITAL_COMMUNITY): Payer: No Typology Code available for payment source

## 2017-08-31 ENCOUNTER — Emergency Department (HOSPITAL_COMMUNITY)
Admission: EM | Admit: 2017-08-31 | Discharge: 2017-08-31 | Disposition: A | Discharge status: Home / Self Care | Payer: No Typology Code available for payment source | Attending: Emergency Medicine | Admitting: Emergency Medicine

## 2017-08-31 ENCOUNTER — Other Ambulatory Visit: Payer: Self-pay

## 2017-08-31 ENCOUNTER — Ambulatory Visit (HOSPITAL_COMMUNITY)
Admission: EM | Admit: 2017-08-31 | Discharge: 2017-08-31 | Discharge status: Against Medical Advice | Payer: No Typology Code available for payment source | Source: Home / Self Care

## 2017-08-31 ENCOUNTER — Encounter (HOSPITAL_COMMUNITY): Payer: Self-pay

## 2017-08-31 DIAGNOSIS — Z23 Encounter for immunization: Secondary | ICD-10-CM | POA: Diagnosis not present

## 2017-08-31 DIAGNOSIS — Y939 Activity, unspecified: Secondary | ICD-10-CM | POA: Diagnosis not present

## 2017-08-31 DIAGNOSIS — Y999 Unspecified external cause status: Secondary | ICD-10-CM | POA: Insufficient documentation

## 2017-08-31 DIAGNOSIS — W109XXA Fall (on) (from) unspecified stairs and steps, initial encounter: Secondary | ICD-10-CM | POA: Diagnosis not present

## 2017-08-31 DIAGNOSIS — Z7722 Contact with and (suspected) exposure to environmental tobacco smoke (acute) (chronic): Secondary | ICD-10-CM | POA: Diagnosis not present

## 2017-08-31 DIAGNOSIS — S8001XA Contusion of right knee, initial encounter: Secondary | ICD-10-CM | POA: Insufficient documentation

## 2017-08-31 DIAGNOSIS — W19XXXA Unspecified fall, initial encounter: Secondary | ICD-10-CM

## 2017-08-31 DIAGNOSIS — E119 Type 2 diabetes mellitus without complications: Secondary | ICD-10-CM | POA: Diagnosis not present

## 2017-08-31 DIAGNOSIS — S0081XA Abrasion of other part of head, initial encounter: Secondary | ICD-10-CM | POA: Diagnosis not present

## 2017-08-31 DIAGNOSIS — S0990XA Unspecified injury of head, initial encounter: Secondary | ICD-10-CM | POA: Diagnosis present

## 2017-08-31 DIAGNOSIS — Y929 Unspecified place or not applicable: Secondary | ICD-10-CM | POA: Insufficient documentation

## 2017-08-31 DIAGNOSIS — I10 Essential (primary) hypertension: Secondary | ICD-10-CM | POA: Diagnosis not present

## 2017-08-31 MED ORDER — TETANUS-DIPHTH-ACELL PERTUSSIS 5-2.5-18.5 LF-MCG/0.5 IM SUSP
0.5000 mL | Freq: Once | INTRAMUSCULAR | Status: AC
  Administered 2017-08-31: 0.5 mL via INTRAMUSCULAR
  Filled 2017-08-31: qty 0.5

## 2017-08-31 NOTE — ED Triage Notes (Signed)
Pt presents with abrasions to R side of face and R knee after falling off 1 step onto sidewalk. -LOC, -nausea

## 2017-08-31 NOTE — ED Provider Notes (Signed)
[facility] EMERGENCY DEPARTMENT Provider Note   CSN: [id number] Arrival date & time: 08/31/17  1719     History   Chief Complaint Chief Complaint  Patient presents with  . Fall    HPI Vanessa Sandoval is a 62 y.o. female.  Patient stumbled while descending stairs, fell from bottom step onto concrete walkway. Patient suffered an abrasion to the right side of her face and to her right knee. She is complaining of pain to the knee. Knee pain increased with walking, movement, palpation.  No loss of consciousness. Tetanus out of date.  The history is provided by the patient. No language interpreter was used.  Fall  This is a new problem. The current episode started 3 to 5 hours ago. Pertinent negatives include no headaches.    Past Medical History:  Diagnosis Date  . Diabetes mellitus without complication (HCC)   . Herpes   . Hyperlipemia   . Hypertension     Patient Active Problem List   Diagnosis Date Noted  . IDDM (insulin dependent diabetes mellitus) (HCC) 02/07/2013    Past Surgical History:  Procedure Laterality Date  . CESAREAN SECTION    . CHOLECYSTECTOMY       OB History   None      Home Medications    Prior to Admission medications   Medication Sig Start Date End Date Taking? Authorizing Provider  Dulaglutide (TRULICITY) 0.75 MG/0.5ML SOPN Inject into the skin once a week.    [provider]  simvastatin (ZOCOR) 5 MG tablet Take 40 mg by mouth daily.     [provider]  triamterene-hydrochlorothiazide (DYAZIDE) 37.5-25 MG capsule Take 1 capsule by mouth daily.    [provider]    Family History Family History  Problem Relation Age of Onset  . Diabetes Mother   . Hyperlipidemia Mother   . Diabetes Father   . Heart disease Father   . Parkinson's disease Father   . Diabetes Brother   . Breast cancer Neg Hx     Social History Social History   Tobacco Use  . Smoking status: Passive Smoke Exposure -  Never Smoker  . Smokeless tobacco: Never Used  Substance Use Topics  . Alcohol use: No  . Drug use: No     Allergies   Patient has no known allergies.   Review of Systems Review of Systems  HENT: Positive for facial swelling.   Eyes: Negative for visual disturbance.  Musculoskeletal: Positive for arthralgias.  Skin: Positive for wound.  Neurological: Negative for headaches.  All other systems reviewed and are negative.    Physical Exam Updated Vital Signs BP 136/71 (BP Location: Right Arm)   Pulse 86   Temp 98.2 F (36.8 C) (Oral)   Resp 18   Ht 5\' 5"  (1.651 m)   Wt 77.6 kg (171 lb)   SpO2 99%   BMI 28.46 kg/m   Physical Exam  Constitutional: She is oriented to person, place, and time. She appears well-developed and well-nourished.  HENT:  Head: Head is with abrasion.    Periorbital abrasion to right lateral aspect  Eyes: Pupils are equal, round, and reactive to light. EOM are normal.  Neck: Normal range of motion. Neck supple.  Cardiovascular: Normal rate and regular rhythm.  Pulmonary/Chest: Effort normal and breath sounds normal.  Abdominal: Soft. Bowel sounds are normal.  Musculoskeletal: She exhibits tenderness. She exhibits no deformity.       Legs: Neurological: She is alert  and oriented to person, place, and time. No cranial nerve deficit or sensory deficit.  Skin: Skin is warm and dry.  Psychiatric: She has a normal mood and affect.  Nursing note and vitals reviewed.    ED Treatments / Results  Labs (all labs ordered are listed, but only abnormal results are displayed) Labs Reviewed - No data to display  EKG None  Radiology No results found.  Procedures Procedures (including critical care time)  Medications Ordered in ED Medications - No data to display   Initial Impression / Assessment and Plan / ED Course  I have reviewed the triage vital signs and the nursing notes.  Pertinent labs & imaging results that were available during  my care of the patient were reviewed by me and considered in my medical decision making (see chart for details).     Patient X-Ray negative for obvious fracture or dislocation.  Pt advised to follow up with orthopedics. Patient given ace wrap while in ED, conservative therapy recommended and discussed. Abrasion to face and right knee. Care instructions provided. Patient will be discharged home & is agreeable with above plan. Returns precautions discussed. Pt appears safe for discharge.  Final Clinical Impressions(s) / ED Diagnoses   Final diagnoses:  Fall, initial encounter  Abrasion of face, initial encounter  Contusion of right knee, initial encounter    ED Discharge Orders    None       Felicie MornSmith, Rosamaria Donn, NP 09/01/17 0135    Clarene DukeLittle, Ambrose Finlandachel Morgan, MD 09/01/17 [phone]

## 2017-08-31 NOTE — ED Notes (Signed)
Patient transported to X-ray 

## 2017-09-06 ENCOUNTER — Ambulatory Visit (INDEPENDENT_AMBULATORY_CARE_PROVIDER_SITE_OTHER): Payer: No Typology Code available for payment source

## 2017-09-06 ENCOUNTER — Ambulatory Visit (HOSPITAL_COMMUNITY)
Admission: EM | Admit: 2017-09-06 | Discharge: 2017-09-06 | Disposition: A | Discharge status: Home / Self Care | Payer: No Typology Code available for payment source | Attending: Family Medicine | Admitting: Family Medicine

## 2017-09-06 ENCOUNTER — Encounter (HOSPITAL_COMMUNITY): Payer: Self-pay | Admitting: Emergency Medicine

## 2017-09-06 DIAGNOSIS — W19XXXA Unspecified fall, initial encounter: Secondary | ICD-10-CM | POA: Diagnosis not present

## 2017-09-06 DIAGNOSIS — M25562 Pain in left knee: Secondary | ICD-10-CM | POA: Diagnosis not present

## 2017-09-06 DIAGNOSIS — W19XXXD Unspecified fall, subsequent encounter: Secondary | ICD-10-CM

## 2017-09-06 MED ORDER — NAPROXEN 375 MG PO TABS: 375.0000 mg | ORAL_TABLET | Freq: Two times a day (BID) | ORAL | 0 refills | Status: DC

## 2017-09-06 NOTE — Discharge Instructions (Signed)
Ice and elevate the knee, especially at the end of the day after increased use. Xray is normal today but does demonstrate arthritis. Naproxen twice a day, take with food. May take additional tylenol while taking this but do not take additional ibuprofen. Continue to follow with your primary care provider if symptoms persist.

## 2017-09-06 NOTE — ED Triage Notes (Signed)
Pt states she tripped and fell last Tuesday, was seen in the ER last week, xrayed, pt states now her LEFT knee is hurting.

## 2017-09-06 NOTE — ED Provider Notes (Signed)
[facility]    CSN: [id number] Arrival date & time: 09/06/17  1752     History   Chief Complaint Chief Complaint  Patient presents with  . Knee Pain    HPI Vanessa Sandoval is a 62 y.o. female.   Vanessa Sandoval presents with complaints of left knee pain. She fell last week 6/4, landing primarily on right side, however did have abrasion to left hand and some bruising to left knee as well. Went to ER and had right knee xray complete due to pain, abrasions and bruising. States two days following the fall she developed left knee pain. Now no longer has right knee pain but only left knee pain. Worse with weight bearing and activity. If she sits too long has to stand as it feels stiff. No previous injury. Has been taking tylenol and ibuprofen which minimally help. No previous left knee injury. No numbness or tingling to her leg or foot. Hx of dm, htn, hyperlipidemia. Her husband is currently in the hospital with pancreatic cancer.    ROS per HPI.      Past Medical History:  Diagnosis Date  . Diabetes mellitus without complication (HCC)   . Herpes   . Hyperlipemia   . Hypertension     Patient Active Problem List   Diagnosis Date Noted  . IDDM (insulin dependent diabetes mellitus) (HCC) 02/07/2013    Past Surgical History:  Procedure Laterality Date  . CESAREAN SECTION    . CHOLECYSTECTOMY      OB History   None      Home Medications    Prior to Admission medications   Medication Sig Start Date End Date Taking? Authorizing Provider  Dulaglutide (TRULICITY) 0.75 MG/0.5ML SOPN Inject into the skin once a week.    [provider]  naproxen (NAPROSYN) 375 MG tablet Take 1 tablet (375 mg total) by mouth 2 (two) times daily. 09/06/17   Georgetta HaberBurky, Jennalee Greaves B, NP  simvastatin (ZOCOR) 5 MG tablet Take 40 mg by mouth daily.     [provider]  triamterene-hydrochlorothiazide (DYAZIDE) 37.5-25 MG capsule Take 1 capsule by mouth daily.    [provider]      Family History Family History  Problem Relation Age of Onset  . Diabetes Mother   . Hyperlipidemia Mother   . Diabetes Father   . Heart disease Father   . Parkinson's disease Father   . Diabetes Brother   . Breast cancer Neg Hx     Social History Social History   Tobacco Use  . Smoking status: Passive Smoke Exposure - Never Smoker  . Smokeless tobacco: Never Used  Substance Use Topics  . Alcohol use: No  . Drug use: No     Allergies   Patient has no known allergies.   Review of Systems Review of Systems   Physical Exam Triage Vital Signs ED Triage Vitals [09/06/17 1814]  Enc Vitals Group     BP 140/60     Pulse Rate 88     Resp 16     Temp 99 F (37.2 C)     Temp src      SpO2 98 %     Weight      Height      Head Circumference      Peak Flow      Pain Score      Pain Loc      Pain Edu?      Excl. in GC?  No data found.  Updated Vital Signs BP 140/60   Pulse 88   Temp 99 F (37.2 C)   Resp 16   SpO2 98%    Physical Exam  Constitutional: She is oriented to person, place, and time. She appears well-developed and well-nourished. No distress.  Cardiovascular: Normal rate, regular rhythm and normal heart sounds.  Pulmonary/Chest: Effort normal and breath sounds normal.  Musculoskeletal:       Left knee: She exhibits erythema and bony tenderness. She exhibits normal range of motion, no swelling, no effusion, no deformity, no laceration, normal alignment, no LCL laxity, normal patellar mobility, normal meniscus and no MCL laxity. Tenderness found.  Healing facial bruising and abrasions; tenderness at patella; mild pain with flexion and extension; no laxity or pain with lateral or medial stress; sensation intact; ambulatory with slight limp to gait   Neurological: She is alert and oriented to person, place, and time.  Skin: Skin is warm and dry.     UC Treatments / Results  Labs (all labs ordered are listed, but only abnormal results are  displayed) Labs Reviewed - No data to display  EKG None  Radiology Dg Knee Complete 4 Views Left  Result Date: 09/06/2017 CLINICAL DATA:  Fall with knee injury.  Now with pain. EXAM: LEFT KNEE - COMPLETE 4+ VIEW COMPARISON:  None. FINDINGS: No fracture. No subluxation or dislocation. No evidence of joint effusion. Trace hypertrophic spurring is visible in all 3 compartments. IMPRESSION: Trace tricompartmental degenerative spurring. No acute bony abnormality. Electronically Signed   By: Kennith Center M.D.   On: 09/06/2017 19:17    Procedures Procedures (including critical care time)  Medications Ordered in UC Medications - No data to display  Initial Impression / Assessment and Plan / UC Course  I have reviewed the triage vital signs and the nursing notes.  Pertinent labs & imaging results that were available during my care of the patient were reviewed by me and considered in my medical decision making (see chart for details).     Xray of left knee obtained s/p fall 6/4. Patient has been ambulatory. Pain primarily at patella with bruising noted. Xray reassuring without acute findings. OA present. Continue with supportive cares, ice, elevation, nsaids for pain, activity as tolerated. Follow with PCP as needed. Patient verbalized understanding and agreeable to plan.  Ambulatory out of clinic without difficulty.    Final Clinical Impressions(s) / UC Diagnoses   Final diagnoses:  Fall, subsequent encounter  Acute pain of left knee     Discharge Instructions     Ice and elevate the knee, especially at the end of the day after increased use. Xray is normal today but does demonstrate arthritis. Naproxen twice a day, take with food. May take additional tylenol while taking this but do not take additional ibuprofen. Continue to follow with your primary care provider if symptoms persist.     ED Prescriptions    Medication Sig Dispense Auth. Provider   naproxen (NAPROSYN) 375 MG  tablet Take 1 tablet (375 mg total) by mouth 2 (two) times daily. 20 tablet Georgetta Haber, NP     Controlled Substance Prescriptions Minor Controlled Substance Registry consulted? Not Applicable   Georgetta Haber, NP 09/06/17 1933

## 2017-12-06 ENCOUNTER — Encounter (HOSPITAL_COMMUNITY): Payer: Self-pay | Admitting: Emergency Medicine

## 2017-12-06 ENCOUNTER — Ambulatory Visit (HOSPITAL_COMMUNITY)
Admission: EM | Admit: 2017-12-06 | Discharge: 2017-12-06 | Disposition: A | Discharge status: Home / Self Care | Payer: No Typology Code available for payment source | Attending: Family Medicine | Admitting: Family Medicine

## 2017-12-06 DIAGNOSIS — M5489 Other dorsalgia: Secondary | ICD-10-CM | POA: Diagnosis not present

## 2017-12-06 DIAGNOSIS — M62838 Other muscle spasm: Secondary | ICD-10-CM

## 2017-12-06 MED ORDER — CYCLOBENZAPRINE HCL 10 MG PO TABS: 5.0000 mg | ORAL_TABLET | Freq: Every day | ORAL | 0 refills | Status: DC

## 2017-12-06 MED ORDER — KETOROLAC TROMETHAMINE 30 MG/ML IJ SOLN
30.0000 mg | Freq: Once | INTRAMUSCULAR | Status: AC
  Administered 2017-12-06: 30 mg via INTRAMUSCULAR

## 2017-12-06 MED ORDER — KETOROLAC TROMETHAMINE 30 MG/ML IJ SOLN
INTRAMUSCULAR | Status: AC
  Filled 2017-12-06: qty 1

## 2017-12-06 MED ORDER — NAPROXEN 500 MG PO TABS: 500.0000 mg | ORAL_TABLET | Freq: Two times a day (BID) | ORAL | 0 refills | Status: DC

## 2017-12-06 NOTE — ED Provider Notes (Signed)
[facility]    CSN: [id number] Arrival date & time: 12/06/17  1254     History   Chief Complaint Chief Complaint  Patient presents with  . Back Pain    HPI KESHAWNA DIX is a 62 y.o. female.   She is a 62 year old female who presents with left upper back pain.  Reports 3 days with intermittent episodes.  Describes it as tightening in the muscle.  She  has been taking Goody's and Tylenol with minimal relief.  She has  also been using a heating pad.  Stretching the area helps at times.  She denies any cough, congestion, chest pain, shortness of breath.  She did help her daughter move some this weekend but reports she did not lift anything heavy that she can remember.  Denies any specific injury. Denies any numbness, tingling in the left arm. Denies any weakness. No recent traveling or hx of PE, DVT. No recent illness with URI symptoms.   Marland Kitchentbros      Past Medical History:  Diagnosis Date  . Diabetes mellitus without complication (HCC)   . Herpes   . Hyperlipemia   . Hypertension     Patient Active Problem List   Diagnosis Date Noted  . IDDM (insulin dependent diabetes mellitus) (HCC) 02/07/2013    Past Surgical History:  Procedure Laterality Date  . CESAREAN SECTION    . CHOLECYSTECTOMY      OB History   None      Home Medications    Prior to Admission medications   Medication Sig Start Date End Date Taking? Authorizing Provider  cyclobenzaprine (FLEXERIL) 10 MG tablet Take 0.5 tablets (5 mg total) by mouth at bedtime. 12/06/17   Slayter Moorhouse, Gloris Manchester A, NP  Dulaglutide (TRULICITY) 0.75 MG/0.5ML SOPN Inject into the skin once a week.    [provider]  naproxen (NAPROSYN) 500 MG tablet Take 1 tablet (500 mg total) by mouth 2 (two) times daily. 12/06/17   Dahlia Byes A, NP  simvastatin (ZOCOR) 5 MG tablet Take 40 mg by mouth daily.     [provider]  triamterene-hydrochlorothiazide (DYAZIDE) 37.5-25 MG capsule Take 1 capsule by mouth daily.     [provider]    Family History Family History  Problem Relation Age of Onset  . Diabetes Mother   . Hyperlipidemia Mother   . Diabetes Father   . Heart disease Father   . Parkinson's disease Father   . Diabetes Brother   . Breast cancer Neg Hx     Social History Social History   Tobacco Use  . Smoking status: Passive Smoke Exposure - Never Smoker  . Smokeless tobacco: Never Used  Substance Use Topics  . Alcohol use: No  . Drug use: No     Allergies   Patient has no known allergies.   Review of Systems Review of Systems   Physical Exam Triage Vital Signs ED Triage Vitals [12/06/17 1312]  Enc Vitals Group     BP (!) 147/54     Pulse Rate 95     Resp 18     Temp 97.9 F (36.6 C)     Temp src      SpO2 98 %     Weight      Height      Head Circumference      Peak Flow      Pain Score 0     Pain Loc      Pain Edu?  Excl. in GC?    No data found.  Updated Vital Signs BP (!) 147/54   Pulse 95   Temp 97.9 F (36.6 C)   Resp 18   SpO2 98%   Visual Acuity Right Eye Distance:   Left Eye Distance:   Bilateral Distance:    Right Eye Near:   Left Eye Near:    Bilateral Near:     Physical Exam  Constitutional: She is oriented to person, place, and time. She appears well-developed and well-nourished.  Very pleasant. Non toxic or ill appearing.     HENT:  Head: Normocephalic and atraumatic.  Eyes: Conjunctivae are normal.  Neck: Normal range of motion.  Cardiovascular: Normal rate, regular rhythm and normal heart sounds.  Pulmonary/Chest:  Lungs clear in all fields. No dyspnea or distress. No retractions or nasal flaring.     Musculoskeletal: Normal range of motion.  Tenderness to palpation of left scapular area. She reports pulling feeling and muscle with range of motion of the left arm.  Good range of motion.  No obvious deformity, swelling or bruising.    Neurological: She is alert and oriented to person, place, and time.    Skin: Skin is warm and dry.  Psychiatric: She has a normal mood and affect.  Nursing note and vitals reviewed.    UC Treatments / Results  Labs (all labs ordered are listed, but only abnormal results are displayed) Labs Reviewed - No data to display  EKG None  Radiology No results found.  Procedures Procedures (including critical care time)  Medications Ordered in UC Medications  ketorolac (TORADOL) 30 MG/ML injection 30 mg (30 mg Intramuscular Given 12/06/17 1442)    Initial Impression / Assessment and Plan / UC Course  I have reviewed the triage vital signs and the nursing notes.  Pertinent labs & imaging results that were available during my care of the patient were reviewed by me and considered in my medical decision making (see chart for details).     Most likely muscle spasm and inflamamtion.  We will go ahead and treat with muscle relaxant and naproxen for pain inflammation. Toradol injection given in clinic. Follow up as needed for continued or worsening symptoms  Final Clinical Impressions(s) / UC Diagnoses   Final diagnoses:  Muscle spasm     Discharge Instructions     It was nice meeting you!!  We will go ahead and treat you for a muscle spasm and inflammation in the muscle. Toradol injection in clinic. Prescription  for Flexeril sent to the pharmacy.  Be aware this medication will make you drowsy Naproxen for pain and inflammation.     ED Prescriptions    Medication Sig Dispense Auth. Provider   cyclobenzaprine (FLEXERIL) 10 MG tablet Take 0.5 tablets (5 mg total) by mouth at bedtime. 20 tablet Teeghan Hammer A, NP   naproxen (NAPROSYN) 500 MG tablet Take 1 tablet (500 mg total) by mouth 2 (two) times daily. 30 tablet Dahlia Byes A, NP     Controlled Substance Prescriptions [place] Controlled Substance Registry consulted? Not Applicable   Janace Aris, NP 12/06/17 1549

## 2017-12-06 NOTE — ED Triage Notes (Signed)
Pt c/o L sided upper back pain, states every so often it feels like its squeezing.

## 2017-12-06 NOTE — Discharge Instructions (Addendum)
It was nice meeting you!!  We will go ahead and treat you for a muscle spasm and inflammation in the muscle. Toradol injection in clinic. Prescription  for Flexeril sent to the pharmacy.  Be aware this medication will make you drowsy Naproxen for pain and inflammation.

## 2018-02-15 ENCOUNTER — Encounter: Payer: Self-pay | Admitting: Registered Nurse

## 2018-02-15 ENCOUNTER — Ambulatory Visit: Payer: Self-pay | Admitting: Registered Nurse

## 2018-02-15 VITALS — BP 126/82 | HR 71 | Temp 96.8°F

## 2018-02-15 DIAGNOSIS — M7712 Lateral epicondylitis, left elbow: Secondary | ICD-10-CM

## 2018-02-15 MED ORDER — IBUPROFEN 600 MG PO TABS: 600.0000 mg | ORAL_TABLET | Freq: Three times a day (TID) | ORAL | 0 refills | Status: AC | PRN

## 2018-02-15 NOTE — Patient Instructions (Signed)
Tennis Elbow Tennis elbow (lateral epicondylitis) is inflammation of the outer tendons of your forearm close to your elbow. Your tendons attach your muscles to your bones. The outer tendons of your forearm are used to extend your wrist, and they attach on the outside part of your elbow. Tennis elbow is often found in people who play tennis, but anyone may get the condition from repeatedly extending the wrist or turning the forearm. What are the causes? This condition is caused by repeatedly extending your wrist and using your hands. It can result from sports or work that requires repetitive forearm movements. Tennis elbow may also be caused by an injury. What increases the risk? You have a higher risk of developing tennis elbow if you play tennis or another racquet sport. You also have a higher risk if you frequently use your hands for work. This condition is also more likely to develop in:  Musicians.  Carpenters, painters, and plumbers.  Cooks.  Cashiers.  People who work in factories.  Construction workers.  Butchers.  People who use computers.  What are the signs or symptoms? Symptoms of this condition include:  Pain and tenderness in your forearm and the outer part of your elbow. You may only feel the pain when you use your arm, or you may feel it even when you are not using your arm.  A burning feeling that runs from your elbow through your arm.  Weak grip in your hands.  How is this diagnosed? This condition may be diagnosed by medical history and physical exam. You may also have other tests, including:  X-rays.  MRI.  How is this treated? Your health care provider will recommend lifestyle adjustments, such as resting and icing your arm. Treatment may also include:  Medicines for inflammation. This may include shots of cortisone if your pain continues.  Physical therapy. This may include massage or exercises.  An elbow brace.  Surgery may eventually be  recommended if your pain does not go away with treatment. Follow these instructions at home: Activity  Rest your elbow and wrist as directed by your health care provider. Try to avoid any activities that caused the problem until your health care provider says that you can do them again.  If a physical therapist teaches you exercises, do all of them as directed.  If you lift an object, lift it with your palm facing upward. This lowers the stress on your elbow. Lifestyle  If your tennis elbow is caused by sports, check your equipment and make sure that: ? You are using it correctly. ? It is the best fit for you.  If your tennis elbow is caused by work, take breaks frequently, if you are able. Talk with your manager about how to best perform tasks in a way that is safe. ? If your tennis elbow is caused by computer use, talk with your manager about any changes that can be made to your work environment. General instructions  If directed, apply ice to the painful area: ? Put ice in a plastic bag. ? Place a towel between your skin and the bag. ? Leave the ice on for 20 minutes, 2-3 times per day.  Take medicines only as directed by your health care provider.  If you were given a brace, wear it as directed by your health care provider.  Keep all follow-up visits as directed by your health care provider. This is important. Contact a health care provider if:  Your pain does not   get better with treatment.  Your pain gets worse.  You have numbness or weakness in your forearm, hand, or fingers. This information is not intended to replace advice given to you by your health care provider. Make sure you discuss any questions you have with your health care provider. Document Released: 03/16/2005 Document Revised: 11/14/2015 Document Reviewed: 03/12/2014 Elsevier Interactive Patient Education  2018 [employer]. Tennis Elbow Rehab Ask your health care provider which exercises are safe for you.  Do exercises exactly as told by your health care provider and adjust them as directed. It is normal to feel mild stretching, pulling, tightness, or discomfort as you do these exercises, but you should stop right away if you feel sudden pain or your pain gets worse. Do not begin these exercises until told by your health care provider. Stretching and range of motion exercises These exercises warm up your muscles and joints and improve the movement and flexibility of your elbow. These exercises also help to relieve pain, numbness, and tingling. Exercise A: Wrist extensor stretch 1. Extend your left / right elbow with your fingers pointing down. 2. Gently pull the palm of your left / right hand toward you until you feel a gentle stretch on the top of your forearm. 3. To increase the stretch, push your left / right hand toward the outer edge or pinkie side of your forearm. 4. Hold this position for ______15____ seconds. Repeat ______3____ times. Complete this exercise _______3___ times a day. If directed by your health care provider, repeat this stretch except do it with a bent elbow this time. Exercise B: Wrist flexor stretch  1. Extend your left / right elbow and turn your palm upward. 2. Gently pull your left / right palm and fingertips back so your wrist extends and your fingers point more toward the ground. 3. You should feel a gentle stretch on the inside of your forearm. 4. Hold this position for ____15______ seconds. Repeat ___2_______ times. Complete this exercise ______3____ times a day. If directed by your health care provider, repeat this stretch except do it with a bent elbow this time. Strengthening exercises These exercises build strength and endurance in your elbow. Endurance is the ability to use your muscles for a long time, even after they get tired. Exercise C: Wrist extensors  1. Sit with your left / right forearm palm-down and fully supported on a table or countertop. Your  elbow should be resting below the height of your shoulder. 2. Let your left / right wrist extend over the edge of the surface. 3. Loosely hold a ___no_______ weight or a piece of rubber exercise band or tubing in your left / right hand. Slowly curl your left / right hand up toward your forearm. If you are using band or tubing, hold the band or tubing in place with your other hand to provide resistance. 4. Hold this position for _____15_____ seconds. 5. Slowly return to the starting position. Repeat _____2_____ times. Complete this exercise ____3______ times a day. Exercise D: Radial deviators  1. Stand with a ___none_______ weight in your left / righthand. Or, sit while holding a rubber exercise band or tubing with your other arm supported on a table or countertop. Position your hand so your thumb is on top. 2. Raise your hand upward in front of you so your thumb travels toward your forearm, or pull up on the rubber tubing. 3. Hold this position for ____15______ seconds. 4. Slowly return to the starting position. Repeat ___2_______  times. Complete this exercise ______3____ times a day. Exercise E: Eccentric wrist extensors 1. Sit with your left / right forearm palm-down and fully supported on a table or countertop. Your elbow should be resting below the height of your shoulder. 2. If told by your health care provider, hold a ___none_______ weight in your hand. 3. Let your left / right wrist extend over the edge of the surface. 4. Use your other hand to lift up your left / right hand toward your forearm. Keep your forearm on the table. 5. Using only the muscles in your left / right hand, slowly lower your hand back down to the starting position. Repeat ____2______ times. Complete this exercise _____3_____ times a day. This information is not intended to replace advice given to you by your health care provider. Make sure you discuss any questions you have with your health care provider. Document  Released: 03/16/2005 Document Revised: 11/20/2015 Document Reviewed: 12/13/2014 Elsevier Interactive Patient Education  [employer]2018 Elsevier Inc.

## 2018-02-15 NOTE — Progress Notes (Signed)
Subjective:    Patient ID: Vanessa Sandoval, female    DOB: 08/09/1955, 62 y.o.   MRN: [id number]  62y/o Caucasian established female pt c/o L elbow pain since Sunday morning, 2 days, woke with pain. No known injury. Small amount of swelling noted by RN Rolly SalterHaley on exam yesterday. Given Ibuprofen 800mg  po TID from clinic OTC stock yesterday. Pain was 7/10 then. Could not fully extend elbow. Worse with palpation by RN yesterday. Today and after few doses Ibuprofen, reports pain is essentially gone except a "twinge" here and there. 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  Right hand dominant  denied new exercise program/hobbies or working in new department for the holidays.     Review of Systems  Constitutional: Negative for activity change, appetite change, chills, diaphoresis, fatigue and fever.  HENT: Negative for congestion, trouble swallowing and voice change.   Eyes: Negative for photophobia and visual disturbance.  Respiratory: Negative for cough, shortness of breath, wheezing and stridor.   Cardiovascular: Negative for chest pain.  Gastrointestinal: Negative for diarrhea, nausea and vomiting.  Endocrine: Negative for cold intolerance and heat intolerance.  Genitourinary: Negative for difficulty urinating.  Musculoskeletal: Positive for arthralgias and myalgias. Negative for back pain, gait problem, joint swelling, neck pain and neck stiffness.  Skin: Negative for color change, pallor, rash and wound.  Allergic/Immunologic: Negative for environmental allergies and food allergies.  Neurological: Negative for dizziness, tremors, seizures, syncope, speech difficulty, weakness, light-headedness, numbness and headaches.  Hematological: Negative for adenopathy. Does not bruise/bleed easily.  Psychiatric/Behavioral: Negative for agitation, confusion and sleep disturbance. The patient is not  nervous/anxious.        Objective:   Physical Exam  Constitutional: She is oriented to person, place, and time. Vital signs are normal. She appears well-developed and well-nourished. She is active.  Non-toxic appearance. She does not have a sickly appearance. She does not appear ill. No distress.  HENT:  Head: Normocephalic and atraumatic.  Right Ear: Hearing and external ear normal.  Left Ear: Hearing and external ear normal.  Nose: Nose normal.  Mouth/Throat: Uvula is midline, oropharynx is clear and moist and mucous membranes are normal. No oropharyngeal exudate.  Eyes: Pupils are equal, round, and reactive to light. Conjunctivae, EOM and lids are normal. Right eye exhibits no discharge. Left eye exhibits no discharge. No scleral icterus.  Neck: Trachea normal, normal range of motion and phonation normal. Neck supple. No tracheal deviation present.  Cardiovascular: Normal rate, regular rhythm, normal heart sounds and intact distal pulses.  Pulses:      Radial pulses are 2+ on the right side, and 2+ on the left side.  Pulmonary/Chest: Effort normal and breath sounds normal. No stridor. No respiratory distress. She has no decreased breath sounds. She has no wheezes. She has no rhonchi. She has no rales.  Abdominal: Soft. She exhibits no distension. There is no guarding.  Musculoskeletal: She exhibits tenderness. She exhibits no edema or deformity.       Right shoulder: Normal.       Left shoulder: Normal.       Right elbow: Normal.      Left elbow: She exhibits normal range of motion, no swelling, no effusion, no deformity and no laceration. Tenderness found. Lateral epicondyle tenderness noted. No radial head, no medial epicondyle and no olecranon process tenderness noted.  Right wrist: Normal.       Left wrist: Normal.       Right hip: Normal.       Left hip: Normal.       Right knee: Normal.       Left knee: Normal.       Cervical back: Normal.       Thoracic back: Normal.        Lumbar back: Normal.       Arms:      Right hand: Normal.       Left hand: Normal.  Lymphadenopathy:       Head (right side): No submental, no submandibular, no tonsillar, no preauricular, no posterior auricular and no occipital adenopathy present.       Head (left side): No submental, no submandibular, no tonsillar, no preauricular, no posterior auricular and no occipital adenopathy present.    She has no cervical adenopathy.       Right cervical: No superficial cervical, no deep cervical and no posterior cervical adenopathy present.      Left cervical: No superficial cervical, no deep cervical and no posterior cervical adenopathy present.  Neurological: She is alert and oriented to person, place, and time. She has normal strength. She is not disoriented. She displays no atrophy and no tremor. No cranial nerve deficit or sensory deficit. She exhibits normal muscle tone. She displays no seizure activity. Coordination and gait normal. GCS eye subscore is 4. GCS verbal subscore is 5. GCS motor subscore is 6.  Bilateral hand grasp equal 5/5; on/off exam table; in/out of chair without difficulty gait sure and steady in hallway  Skin: Skin is warm, dry and intact. Capillary refill takes less than 2 seconds. No abrasion, no bruising, no ecchymosis and no rash noted. She is not diaphoretic. No cyanosis or erythema. No pallor. Nails show no clubbing.  Psychiatric: She has a normal mood and affect. Her speech is normal and behavior is normal. Judgment and thought content normal. She is not actively hallucinating. Cognition and memory are normal. She is attentive.         exquisitely tender insertion site left epicondyle lateral ; full AROM; fitted and distributed lateral epicondylitis band left from clinic stock.   Demonstrated application to patient along with care of band.   Assessment & Plan:  A-left lateral epicondylitis  P-motirn 600mg  po TID prn pain OTC at home take at least daily with food  for 2 weeks; cryotherapy 15 minutes up to TID; refused biofreeze topical QID prn pain from clinic stock;  printed and given lateral epicondylitis rehab exercises and lateral epicondylitis exitcare handouts;  follow up in 2 weeks if no improvement with plan of care.  Use headset instead of holding phone receiver. Discussed rest, nsaid x 2 weeks, stretching, lateral epicondylitis strap  and icing TID. Consider formal Physical Therapy referral if no relief with plan of care. Discussed with patient takes longer to resolve when ongoing for months consider orthopedics and steroid injection if no relief with NSAID, rest, ice, strap.  Hydrate, hydrate, hydrate, avoid dehydration when taking NSAID.  Will consider orthopedics and formal PT if fails conservative treatment. Patient verbalized understanding of instructions, agreed with plan of care and had no further questions at this time.

## 2018-09-28 ENCOUNTER — Telehealth: Payer: Self-pay | Admitting: *Deleted

## 2018-09-28 DIAGNOSIS — Z20822 Contact with and (suspected) exposure to covid-19: Secondary | ICD-10-CM

## 2018-09-28 NOTE — Telephone Encounter (Signed)
Pt c/o cough, chest congestion, runny nose that started this past weekend (~6/27).  She works in a [employer]. Has multiple risk factors for severe disease. Denies any known sick contacts. Discussed with pt that for these reasons, we would refer her for testing, and that [facility] would contact to discuss further and handle scheduling. Instructed to remain at home and quarantine until results are available and if positive, further instructions will be given. She is agreeable. She prefers the [facility] drive thru site and is available the rest of the day. Phone [phone]. Thank you.

## 2018-09-28 NOTE — Telephone Encounter (Signed)
Scheduled patient for COVID 19 test tomorrow at 9:30 am at [facility].  Testing protocol reviewed with patient.

## 2018-09-29 ENCOUNTER — Other Ambulatory Visit: Payer: Self-pay

## 2018-09-29 DIAGNOSIS — Z20822 Contact with and (suspected) exposure to covid-19: Secondary | ICD-10-CM

## 2018-10-03 ENCOUNTER — Encounter: Payer: Self-pay | Admitting: *Deleted

## 2018-10-05 LAB — NOVEL CORONAVIRUS, NAA: SARS-CoV-2, NAA: NOT DETECTED

## 2018-10-06 ENCOUNTER — Other Ambulatory Visit: Payer: Self-pay | Admitting: *Deleted

## 2018-10-06 MED ORDER — ONETOUCH DELICA LANCETS 30G MISC: 1.0000 [IU] | Freq: Two times a day (BID) | 0 refills | Status: DC

## 2018-10-06 MED ORDER — ONETOUCH VERIO VI STRP: ORAL_STRIP | 0 refills | Status: DC

## 2018-10-06 NOTE — Telephone Encounter (Signed)
Pt reporting difficulty contacting pcp office and requesting bridge refill of her lancets and test strips for glucose meter.

## 2019-10-25 ENCOUNTER — Encounter: Payer: Self-pay | Admitting: Registered Nurse

## 2019-10-25 ENCOUNTER — Telehealth: Payer: Self-pay | Admitting: Registered Nurse

## 2019-10-25 DIAGNOSIS — J069 Acute upper respiratory infection, unspecified: Secondary | ICD-10-CM

## 2019-10-25 MED ORDER — FLUTICASONE PROPIONATE 50 MCG/ACT NA SUSP: 1.0000 | Freq: Two times a day (BID) | NASAL | 0 refills | Status: AC

## 2019-10-25 MED ORDER — SALINE SPRAY 0.65 % NA SOLN: 2.0000 | NASAL | 0 refills | Status: AC

## 2019-10-25 NOTE — Patient Instructions (Signed)
COVID-19 COVID-19 is a respiratory infection that is caused by a virus called severe acute respiratory syndrome coronavirus 2 (SARS-CoV-2). The disease is also known as coronavirus disease or novel coronavirus. In some people, the virus may not cause any symptoms. In others, it may cause a serious infection. The infection can get worse quickly and can lead to complications, such as:  Pneumonia, or infection of the lungs.  Acute respiratory distress syndrome or ARDS. This is a condition in which fluid build-up in the lungs prevents the lungs from filling with air and passing oxygen into the blood.  Acute respiratory failure. This is a condition in which there is not enough oxygen passing from the lungs to the body or when carbon dioxide is not passing from the lungs out of the body.  Sepsis or septic shock. This is a serious bodily reaction to an infection.  Blood clotting problems.  Secondary infections due to bacteria or fungus.  Organ failure. This is when your body's organs stop working. The virus that causes COVID-19 is contagious. This means that it can spread from person to person through droplets from coughs and sneezes (respiratory secretions). What are the causes? This illness is caused by a virus. You may catch the virus by:  Breathing in droplets from an infected person. Droplets can be spread by a person breathing, speaking, singing, coughing, or sneezing.  Touching something, like a table or a doorknob, that was exposed to the virus (contaminated) and then touching your mouth, nose, or eyes. What increases the risk? Risk for infection You are more likely to be infected with this virus if you:  Are within 6 feet (2 meters) of a person with COVID-19.  Provide care for or live with a person who is infected with COVID-19.  Spend time in crowded indoor spaces or live in shared housing. Risk for serious illness You are more likely to become seriously ill from the virus if  you:  Are 50 years of age or older. The higher your age, the more you are at risk for serious illness.  Live in a nursing home or long-term care facility.  Have cancer.  Have a long-term (chronic) disease such as: ? Chronic lung disease, including chronic obstructive pulmonary disease or asthma. ? A long-term disease that lowers your body's ability to fight infection (immunocompromised). ? Heart disease, including heart failure, a condition in which the arteries that lead to the heart become narrow or blocked (coronary artery disease), a disease which makes the heart muscle thick, weak, or stiff (cardiomyopathy). ? Diabetes. ? Chronic kidney disease. ? Sickle cell disease, a condition in which red blood cells have an abnormal "sickle" shape. ? Liver disease.  Are obese. What are the signs or symptoms? Symptoms of this condition can range from mild to severe. Symptoms may appear any time from 2 to 14 days after being exposed to the virus. They include:  A fever or chills.  A cough.  Difficulty breathing.  Headaches, body aches, or muscle aches.  Runny or stuffy (congested) nose.  A sore throat.  New loss of taste or smell. Some people may also have stomach problems, such as nausea, vomiting, or diarrhea. Other people may not have any symptoms of COVID-19. How is this diagnosed? This condition may be diagnosed based on:  Your signs and symptoms, especially if: ? You live in an area with a COVID-19 outbreak. ? You recently traveled to or from an area where the virus is common. ? You   provide care for or live with a person who was diagnosed with COVID-19. ? You were exposed to a person who was diagnosed with COVID-19.  A physical exam.  Lab tests, which may include: ? Taking a sample of fluid from the back of your nose and throat (nasopharyngeal fluid), your nose, or your throat using a swab. ? A sample of mucus from your lungs (sputum). ? Blood tests.  Imaging tests,  which may include, X-rays, CT scan, or ultrasound. How is this treated? At present, there is no medicine to treat COVID-19. Medicines that treat other diseases are being used on a trial basis to see if they are effective against COVID-19. Your health care provider will talk with you about ways to treat your symptoms. For most people, the infection is mild and can be managed at home with rest, fluids, and over-the-counter medicines. Treatment for a serious infection usually takes places in a hospital intensive care unit (ICU). It may include one or more of the following treatments. These treatments are given until your symptoms improve.  Receiving fluids and medicines through an IV.  Supplemental oxygen. Extra oxygen is given through a tube in the nose, a face mask, or a hood.  Positioning you to lie on your stomach (prone position). This makes it easier for oxygen to get into the lungs.  Continuous positive airway pressure (CPAP) or bi-level positive airway pressure (BPAP) machine. This treatment uses mild air pressure to keep the airways open. A tube that is connected to a motor delivers oxygen to the body.  Ventilator. This treatment moves air into and out of the lungs by using a tube that is placed in your windpipe.  Tracheostomy. This is a procedure to create a hole in the neck so that a breathing tube can be inserted.  Extracorporeal membrane oxygenation (ECMO). This procedure gives the lungs a chance to recover by taking over the functions of the heart and lungs. It supplies oxygen to the body and removes carbon dioxide. Follow these instructions at home: Lifestyle  If you are sick, stay home except to get medical care. Your health care provider will tell you how long to stay home. Call your health care provider before you go for medical care.  Rest at home as told by your health care provider.  Do not use any products that contain nicotine or tobacco, such as cigarettes,  e-cigarettes, and chewing tobacco. If you need help quitting, ask your health care provider.  Return to your normal activities as told by your health care provider. Ask your health care provider what activities are safe for you. General instructions  Take over-the-counter and prescription medicines only as told by your health care provider.  Drink enough fluid to keep your urine pale yellow.  Keep all follow-up visits as told by your health care provider. This is important. How is this prevented?  There is no vaccine to help prevent COVID-19 infection. However, there are steps you can take to protect yourself and others from this virus. To protect yourself:   Do not travel to areas where COVID-19 is a risk. The areas where COVID-19 is reported change often. To identify high-risk areas and travel restrictions, check the CDC travel website: wwwnc.cdc.gov/travel/notices  If you live in, or must travel to, an area where COVID-19 is a risk, take precautions to avoid infection. ? Stay away from people who are sick. ? Wash your hands often with soap and water for 20 seconds. If soap and water   are not available, use an alcohol-based hand sanitizer. ? Avoid touching your mouth, face, eyes, or nose. ? Avoid going out in public, follow guidance from your state and local health authorities. ? If you must go out in public, wear a cloth face covering or face mask. Make sure your mask covers your nose and mouth. ? Avoid crowded indoor spaces. Stay at least 6 feet (2 meters) away from others. ? Disinfect objects and surfaces that are frequently touched every day. This may include:  Counters and tables.  Doorknobs and light switches.  Sinks and faucets.  Electronics, such as phones, remote controls, keyboards, computers, and tablets. To protect others: If you have symptoms of COVID-19, take steps to prevent the virus from spreading to others.  If you think you have a COVID-19 infection, contact  your health care provider right away. Tell your health care team that you think you may have a COVID-19 infection.  Stay home. Leave your house only to seek medical care. Do not use public transport.  Do not travel while you are sick.  Wash your hands often with soap and water for 20 seconds. If soap and water are not available, use alcohol-based hand sanitizer.  Stay away from other members of your household. Let healthy household members care for children and pets, if possible. If you have to care for children or pets, wash your hands often and wear a mask. If possible, stay in your own room, separate from others. Use a different bathroom.  Make sure that all people in your household wash their hands well and often.  Cough or sneeze into a tissue or your sleeve or elbow. Do not cough or sneeze into your hand or into the air.  Wear a cloth face covering or face mask. Make sure your mask covers your nose and mouth. Where to find more information  Centers for Disease Control and Prevention: www.cdc.gov/coronavirus/2019-ncov/index.html  World Health Organization: www.who.int/health-topics/coronavirus Contact a health care provider if:  You live in or have traveled to an area where COVID-19 is a risk and you have symptoms of the infection.  You have had contact with someone who has COVID-19 and you have symptoms of the infection. Get help right away if:  You have trouble breathing.  You have pain or pressure in your chest.  You have confusion.  You have bluish lips and fingernails.  You have difficulty waking from sleep.  You have symptoms that get worse. These symptoms may represent a serious problem that is an emergency. Do not wait to see if the symptoms will go away. Get medical help right away. Call your local emergency services (911 in the [place] Do not drive yourself to the hospital. Let the emergency medical personnel know if you think you have  COVID-19. Summary  COVID-19 is a respiratory infection that is caused by a virus. It is also known as coronavirus disease or novel coronavirus. It can cause serious infections, such as pneumonia, acute respiratory distress syndrome, acute respiratory failure, or sepsis.  The virus that causes COVID-19 is contagious. This means that it can spread from person to person through droplets from breathing, speaking, singing, coughing, or sneezing.  You are more likely to develop a serious illness if you are 50 years of age or older, have a weak immune system, live in a nursing home, or have chronic disease.  There is no medicine to treat COVID-19. Your health care provider will talk with you about ways to treat your symptoms.    Take steps to protect yourself and others from infection. Wash your hands often and disinfect objects and surfaces that are frequently touched every day. Stay away from people who are sick and wear a mask if you are sick. This information is not intended to replace advice given to you by your health care provider. Make sure you discuss any questions you have with your health care provider. Document Revised: 01/13/2019 Document Reviewed: 04/21/2018 Elsevier Patient Education  2020 Elsevier Inc.  Nonallergic Rhinitis Nonallergic rhinitis is a condition that causes symptoms that affect the nose, such as a runny nose and a stuffed-up nose (nasal congestion) that can make it hard to breathe through the nose. This condition is different from having an allergy (allergic rhinitis). Allergic rhinitis occurs when the body's defense system (immune system) reacts to a substance that you are allergic to (allergen), such as pollen, pet dander, mold, or dust. Nonallergic rhinitis has many similar symptoms, but it is not caused by allergens. Nonallergic rhinitis can be a short-term or long-term problem. What are the causes? This condition can be caused by many different things. Some common types  of nonallergic rhinitis include: Infectious rhinitis  This is usually due to an infection in the upper respiratory tract. Vasomotor rhinitis  This is the most common type of long-term nonallergic rhinitis.  It is caused by too much blood flow through the nose, which makes the tissue inside of the nose swell.  Symptoms are often triggered by strong odors, cold air, stress, drinking alcohol, cigarette smoke, or changes in the weather. Occupational rhinitis  This type is caused by triggers in the workplace, such as chemicals, dusts, animal dander, or air pollution. Hormonal rhinitis  This type occurs in women as a result of an increase in the female hormone estrogen.  It may occur during pregnancy, puberty, and menstrual cycles.  Symptoms improve when estrogen levels drop. Drug-induced rhinitis Several drugs can cause nonallergic rhinitis, including:  Medicines that are used to treat high blood pressure, heart disease, and Parkinson disease.  Aspirin and NSAIDs.  Over-the-counter nasal decongestant sprays. These can cause a type of nonallergic rhinitis (rhinitis medicamentosa) when they are used for more than a few days. Nonallergic rhinitis with eosinophilia syndrome (NARES)  This type is caused by having too much of a certain type of white blood cell (eosinophil). Nonallergic rhinitis can also be caused by a reaction to eating hot or spicy foods. This does not usually cause long-term symptoms. In some cases, the cause of nonallergic rhinitis is not known. What increases the risk? You are more likely to develop this condition if:  You are 13-50 years of age.  You are a woman. Women are twice as likely to have this condition. What are the signs or symptoms? Common symptoms of this condition include:  Nasal congestion.  Runny nose.  The feeling of mucus going down the back of the throat (postnasal drip).  Trouble sleeping at night and daytime sleepiness. Less common  symptoms include:  Sneezing.  Coughing.  Itchy nose.  Bloodshot eyes. How is this diagnosed? This condition may be diagnosed based on:  Your symptoms and medical history.  A physical exam.  Allergy testing to rule out allergic rhinitis. You may have skin tests or blood tests. In some cases, the health care provider may take a swab of nasal secretions to look for an increased number of eosinophils. This would be done to confirm a diagnosis of NARES. How is this treated? Treatment for this condition  depends on the cause. No single treatment works for everyone. Work with your health care provider to find the best treatment for you. Treatment may include:  Avoiding the things that trigger your symptoms.  Using medicines to relieve congestion, such as: ? Steroid nasal spray. There are many types. You may need to try a few to find out which one works best. ? Decongestant medicine. This may be an oral medicine or a nasal spray. These medicines are only used for a short time.  Using medicines to relieve a runny nose. These may include antihistamine medicines or anticholinergic nasal sprays.  Surgery to remove tissue from inside the nose may be needed in severe cases if the condition has not improved after 6-12 months of medical treatment. Follow these instructions at home:  Take or use over-the-counter and prescription medicines only as told by your health care provider. Do not stop using your medicine even if you start to feel better.  Use salt-water (saline) rinses or other solutions (nasal washes or irrigations) to wash or rinse out the inside of your nose as told by your health care provider.  Do not take NSAIDs or medicines that contain aspirin if they make your symptoms worse.  Do not drink alcohol if it makes your symptoms worse.  Do not use any tobacco products, such as cigarettes, chewing tobacco, and e-cigarettes. If you need help quitting, ask your health care  provider.  Avoid secondhand smoke.  Get some exercise every day. Exercise may help reduce symptoms of nonallergic rhinitis for some people. Ask your health care provider how much exercise and what types of exercise are safe for you.  Sleep with the head of your bed raised (elevated). This may reduce nighttime nasal congestion.  Keep all follow-up visits as told by your health care provider. This is important. Contact a health care provider if:  You have a fever.  Your symptoms are getting worse at home.  Your symptoms are not responding to medicine.  You develop new symptoms, especially a headache or nosebleed. This information is not intended to replace advice given to you by your health care provider. Make sure you discuss any questions you have with your health care provider. Document Revised: 02/26/2017 Document Reviewed: 06/06/2015 Elsevier Patient Education  2020 [employer].  How to Perform a Sinus Rinse A sinus rinse is a home treatment that is used to rinse your sinuses with a sterile mixture of salt and water (saline solution). Sinuses are air-filled spaces in your skull behind the bones of your face and forehead that open into your nasal cavity. A sinus rinse can help to clear mucus, dirt, dust, or pollen from your nasal cavity. You may do a sinus rinse when you have a cold, a virus, nasal allergy symptoms, a sinus infection, or stuffiness in your nose or sinuses. Talk with your health care provider about whether a sinus rinse might help you. What are the risks? A sinus rinse is generally safe and effective. However, there are a few risks, which include:  A burning sensation in your sinuses. This may happen if you do not make the saline solution as directed. Be sure to follow all directions when making the saline solution.  Nasal irritation.  Infection from contaminated water. This is rare, but possible. Do not do a sinus rinse if you have had ear or nasal surgery, ear  infection, or blocked ears. Supplies needed:  Saline solution or powder.  Distilled or sterile water may be needed  to mix with saline powder. ? You may use boiled and cooled tap water. Boil tap water for 5 minutes; cool until it is lukewarm. Use within 24 hours. ? Do not use regular tap water to mix with the saline solution.  Neti pot or nasal rinse bottle. These supplies release the saline solution into your nose and through your sinuses. Neti pots and nasal rinse bottles can be purchased at [employer], a health food store, or online. How to perform a sinus rinse  1. Wash your hands with soap and water. 2. Wash your device according to the directions that came with the product and then dry it. 3. Use the solution that comes with your product or one that is sold separately in stores. Follow the mixing directions on the package if you need to mix with sterile or distilled water. 4. Fill the device with the amount of saline solution noted in the device instructions. 5. Stand over a sink and tilt your head sideways over the sink. 6. Place the spout of the device in your upper nostril (the one closer to the ceiling). 7. Gently pour or squeeze the saline solution into your nasal cavity. The liquid should drain out from the lower nostril if you are not too congested. 8. While rinsing, breathe through your open mouth. 9. Gently blow your nose to clear any mucus and rinse solution. Blowing too hard may cause ear pain. 10. Repeat in your other nostril. 11. Clean and rinse your device with clean water and then air-dry it. Talk with your health care provider or pharmacist if you have questions about how to do a sinus rinse. Summary  A sinus rinse is a home treatment that is used to rinse your sinuses with a sterile mixture of salt and water (saline solution).  A sinus rinse is generally safe and effective. Follow all instructions carefully.  Before doing a sinus rinse, talk with your  health care provider about whether it would be helpful for you. This information is not intended to replace advice given to you by your health care provider. Make sure you discuss any questions you have with your health care provider. Document Revised: 01/11/2017 Document Reviewed: 01/11/2017 Elsevier Patient Education  2020 [employer].

## 2019-10-25 NOTE — Telephone Encounter (Signed)
Notified by HR Tim patient called with URI symptoms today and instructed to work remote and discuss covid testing with clinic staff.  Patient contacted at home via phone.  Sunday 7/25 sore throat was with her grandchildren over the weekend and one of them with cough/runny nose no known covid contacts "I get a summer cold every year"  Monday 7/26 sore throat  Tues 7/27 congestion nasal and rhinitis and sore throat resolved.  Has received both pfizer covid vaccines/fully vaccinated 3/24 and 07/15/2019  PMHx Type II diabetes, BMI 29 176lbs 65 inches HTN  email [email or web address] [phone] preferred telephone number.  Patient prefers testing at [facility].  Assisted patient with appt and reviewed testing instructions with patient scheduled tomorrow 7/29 at 0945. Pt began quarantine today and remote work from home.  Patient did not develop sx of cough, trouble breathing, chest pain, nausea, vomiting, diarrhea, HA, fatigue, body aches, fever or chills. Positive for sore throat, rhinitis and nasal congestion.   Pt previously advised by HR to follow 10 day  symptomatic quarantine per CDC recommendations. Day 1 of quarantine was 10/25/2019; symptom day 4.  Plan for testing on Day 5, date 10/26/2019. Pt lives in [place]. Reviewed drive up testing instructions and directions with pt, ie attend appt time, remain in vehicle, wear mask, results back in 2-3 days. Will f/u pt on 7/31 for results discussed with her [facility] partner states they will send email with results or she can log onto their website.  Voicemail will not be left with results.  She can have provider visit audio or video if her test results abnormal.  It is recommended she follow up with [facility] and [facility] will not forward test results to any provider it is her responsibility to share results with providers that she wants to receive results.  Patient on vacation through 8 Aug and as long as sx improving and no fever in previous >24hrs  without antipyretics, pt to complete quarantine 31 Oct 2019 and return to work 11/06/2019.  Reviewed possible Covid sx including cough, shortness of breath with exertion or at rest, runny nose, congestion, sinus pain/pressure, sore throat, fever/chills, body aches, fatigue, loss of taste/smell, GI symptoms of nausea/vomiting/diarrhea. Also reviewed same day/emergent eval/ER precautions of dizziness/syncope, confusion, blue tint to lips/face, severe shortness of breath/difficulty breathing/wheezing. Discussed with patient delta variant symptoms in vaccinated adults has been cold type symptoms e.g. cough, runny nose, congestion without fever typically.  Patient to isolate in her home and if possible use only one bathroom if living with others in home.  Wear mask when out of room to help prevent spread to others in household.  Frequent handwashing and dishwasher or hot water to wash reusable dishes/glasses/silverware.   Pt verbalized understanding and agreement with plan of care. No further questions/concerns at this time. Pt reminded to contact clinic with any changes in sx or questions/concerns. Testing order and appt placed [facility] online website.  Patient may use normal saline nasal spray 2 sprays each nostril q2h wa as needed. flonase 1 spray each nostril BID #1 RF0 electronic Rx entered for patient as she has FSA.  Patient denied personal or family history of ENT cancer.  Shower prior to bedtime.  If allergic dust/dust mites recommend mattress/pillow covers/encasements; washing linens, vacuuming, sweeping, dusting weekly.  Call or return to clinic as needed if these symptoms worsen or fail to improve as anticipated.   [employer]care handout on covid, acute rhinitis nonallergic and sinus rinse.  Discussed honey with  lemon, frozen berries or salt water gargles for throat irritation.  Avoid large portions of dairy as can increase mucous production.  Caution with honey as may raise her blood sugars.  Avoid  dehydration.  Patient verbalized understanding of instructions, agreed with plan of care and had no further questions at this time.  P2:  Avoidance and hand washing.

## 2019-10-28 ENCOUNTER — Telehealth: Payer: Self-pay | Admitting: Registered Nurse

## 2019-10-28 NOTE — Telephone Encounter (Signed)
Notified by HR Tim patient reported negative covid test at [facility].  Attempted to reach patient via telephone no answer left message asking if she has had any worsening symptoms or concerns that she needs to discuss with me.  She can email [email or web address] and I will try to reach her by telephone again tomorrow to follow up with her.

## 2019-10-29 NOTE — Telephone Encounter (Signed)
Patient contacted via telephone stated only has sore throat and some post nasal drip.  Verified [facility] notified her covid test results negative.  Denied fever.  Symptoms slowly resolving.  Using flonase and nasal saline.  Discussed salt water gargles, soup broth and honey with lemon can soothe sore throat also.  Denied ear pain/cough/shortness of breath/fever/chills/n/v/d.  Patient plans to be on vacation this week  Follow up if new or worsening symptoms prn.  Patient verbalized understanding information/instructions, agreed with plan of care and had no further questions at this time.

## 2019-11-01 ENCOUNTER — Ambulatory Visit
Admission: EM | Admit: 2019-11-01 | Discharge: 2019-11-01 | Disposition: A | Discharge status: Home / Self Care | Payer: PRIVATE HEALTH INSURANCE | Attending: Emergency Medicine | Admitting: Emergency Medicine

## 2019-11-01 ENCOUNTER — Other Ambulatory Visit: Payer: Self-pay

## 2019-11-01 DIAGNOSIS — M79674 Pain in right toe(s): Secondary | ICD-10-CM

## 2019-11-01 MED ORDER — NAPROXEN 500 MG PO TABS: 500.0000 mg | ORAL_TABLET | Freq: Two times a day (BID) | ORAL | 0 refills | Status: DC

## 2019-11-01 NOTE — ED Triage Notes (Signed)
Pt c/o rt great toe throbbing pain this am. Denies injury.

## 2019-11-01 NOTE — Discharge Instructions (Addendum)
RICE: rest, ice, compression, elevation as needed for pain.   Cold therapy (ice packs) can be used to help swelling both after injury and after prolonged use of areas of chronic pain/aches.  Pain medication:  500 mg Naprosyn/Aleve (naproxen) every 12 hours with food:  AVOID other NSAIDs while taking this (may have Tylenol). 

## 2019-11-01 NOTE — ED Provider Notes (Signed)
[facility]    CSN: [id number] Arrival date & time: 11/01/19  0809      History   Chief Complaint Chief Complaint  Patient presents with  . Toe Pain    HPI Vanessa Sandoval is a 64 y.o. female with history diabetes, hypertension, obesity presenting for right great toe MTP pain since this morning.  States it felt a little stiff yesterday.  No injury, numbness, discoloration or deformity.  Denies recent change in medications.  Does report history of elevated uric acid level: Not on allopurinol.  Never formally diagnosed with gout.      Past Medical History:  Diagnosis Date  . Diabetes mellitus without complication (HCC)   . Herpes   . Hyperlipemia   . Hypertension     Patient Active Problem List   Diagnosis Date Noted  . IDDM (insulin dependent diabetes mellitus) 02/07/2013    Past Surgical History:  Procedure Laterality Date  . CESAREAN SECTION    . CHOLECYSTECTOMY      OB History   No obstetric history on file.      Home Medications    Prior to Admission medications   Medication Sig Start Date End Date Taking? Authorizing Provider  BD PEN NEEDLE NANO 2ND GEN 32G X 4 MM MISC Inject 1 Product into the skin Nightly. 09/11/19   [provider]  FARXIGA 10 MG TABS tablet Take 10 mg by mouth daily. 09/26/19   [provider]  fluticasone (FLONASE) 50 MCG/ACT nasal spray Place 1 spray into both nostrils 2 (two) times daily. 10/25/19 12/24/19  Betancourt, Jarold Song, NP  glucose blood (ONETOUCH VERIO) test strip Use as instructed 10/06/18   Betancourt, Jarold Song, NP  LANTUS SOLOSTAR 100 UNIT/ML Solostar Pen Inject 40 Units into the skin daily after supper. 09/09/19   [provider]  naproxen (NAPROSYN) 500 MG tablet Take 1 tablet (500 mg total) by mouth 2 (two) times daily. 11/01/19   Hall-Potvin, [place], PA-C  OneTouch Delica Lancets 30G MISC 1 Units by Does not apply route 2 (two) times a day. 10/06/18   Betancourt, Jarold Song, NP  simvastatin  (ZOCOR) 5 MG tablet Take 40 mg by mouth daily.     [provider]  sodium chloride (OCEAN) 0.65 % SOLN nasal spray Place 2 sprays into both nostrils every 2 (two) hours while awake. 10/25/19 11/24/19  Betancourt, Jarold Song, NP  triamterene-hydrochlorothiazide (DYAZIDE) 37.5-25 MG capsule Take 1 capsule by mouth daily.    [provider]    Family History Family History  Problem Relation Age of Onset  . Diabetes Mother   . Hyperlipidemia Mother   . Diabetes Father   . Heart disease Father   . Parkinson's disease Father   . Diabetes Brother   . Breast cancer Neg Hx     Social History Social History   Tobacco Use  . Smoking status: Passive Smoke Exposure - Never Smoker  . Smokeless tobacco: Never Used  Substance Use Topics  . Alcohol use: No  . Drug use: No     Allergies   Patient has no known allergies.   Review of Systems As per HPI   Physical Exam Triage Vital Signs ED Triage Vitals  Enc Vitals Group     BP      Pulse      Resp      Temp      Temp src      SpO2      Weight  Height      Head Circumference      Peak Flow      Pain Score      Pain Loc      Pain Edu?      Excl. in GC?    No data found.  Updated Vital Signs BP (!) 147/81 (BP Location: Left Arm)   Pulse 93   Temp 97.9 F (36.6 C) (Oral)   Resp 18   SpO2 97%   Visual Acuity Right Eye Distance:   Left Eye Distance:   Bilateral Distance:    Right Eye Near:   Left Eye Near:    Bilateral Near:     Physical Exam Constitutional:      General: She is not in acute distress. HENT:     Head: Normocephalic and atraumatic.  Eyes:     General: No scleral icterus.    Pupils: Pupils are equal, round, and reactive to light.  Cardiovascular:     Rate and Rhythm: Normal rate.  Pulmonary:     Effort: Pulmonary effort is normal.  Musculoskeletal:        General: Tenderness present. No swelling. Normal range of motion.     Comments: Right great toe with MTP tenderness    Skin:    Capillary Refill: Capillary refill takes less than 2 seconds.     Coloration: Skin is not jaundiced or pale.     Findings: No erythema.  Neurological:     General: No focal deficit present.     Mental Status: She is alert and oriented to person, place, and time.      UC Treatments / Results  Labs (all labs ordered are listed, but only abnormal results are displayed) Labs Reviewed - No data to display  EKG   Radiology No results found.  Procedures Procedures (including critical care time)  Medications Ordered in UC Medications - No data to display  Initial Impression / Assessment and Plan / UC Course  I have reviewed the triage vital signs and the nursing notes.  Pertinent labs & imaging results that were available during my care of the patient were reviewed by me and considered in my medical decision making (see chart for details).     H&P suspicious for gout versus general toe pain: We will try supportive care, patient follow-up with PCP for repeat evaluation as she may need to be on allopurinol.  Return precautions discussed, pt verbalized understanding and is agreeable to plan. Final Clinical Impressions(s) / UC Diagnoses   Final diagnoses:  Pain of right great toe     Discharge Instructions     RICE: rest, ice, compression, elevation as needed for pain.   Cold therapy (ice packs) can be used to help swelling both after injury and after prolonged use of areas of chronic pain/aches.  Pain medication:  500 mg Naprosyn/Aleve (naproxen) every 12 hours with food:  AVOID other NSAIDs while taking this (may have Tylenol).    ED Prescriptions    Medication Sig Dispense Auth. Provider   naproxen (NAPROSYN) 500 MG tablet Take 1 tablet (500 mg total) by mouth 2 (two) times daily. 30 tablet Hall-Potvin, [place], PA-C     PDMP not reviewed this encounter.   Hall-Potvin, [place], [place] 11/01/19 [phone]

## 2019-11-04 ENCOUNTER — Encounter: Payer: Self-pay | Admitting: Emergency Medicine

## 2019-11-04 ENCOUNTER — Other Ambulatory Visit: Payer: Self-pay

## 2019-11-04 ENCOUNTER — Ambulatory Visit
Admission: EM | Admit: 2019-11-04 | Discharge: 2019-11-04 | Disposition: A | Discharge status: Home / Self Care | Payer: PRIVATE HEALTH INSURANCE | Attending: Physician Assistant | Admitting: Physician Assistant

## 2019-11-04 DIAGNOSIS — M79674 Pain in right toe(s): Secondary | ICD-10-CM

## 2019-11-04 MED ORDER — INDOMETHACIN 50 MG PO CAPS: 50.0000 mg | ORAL_CAPSULE | Freq: Three times a day (TID) | ORAL | 0 refills | Status: DC

## 2019-11-04 NOTE — ED Provider Notes (Signed)
[facility]    CSN: [id number] Arrival date & time: 11/04/19  1036      History   Chief Complaint Chief Complaint  Patient presents with   Foot Pain    HPI Vanessa Sandoval is a 64 y.o. female.   64 year old female with history of DM, HTN, HLD comes in for 5-day history of right great toe pain and swelling.  Was seen here 3 days ago, started on naproxen for possible gout versus inflammatory pain without relief.  States swelling has gotten worse.  Denies spreading erythema, warmth, fever.  History of IDDM, not controlled.     Past Medical History:  Diagnosis Date   Diabetes mellitus without complication (HCC)    Herpes    Hyperlipemia    Hypertension     Patient Active Problem List   Diagnosis Date Noted   IDDM (insulin dependent diabetes mellitus) 02/07/2013    Past Surgical History:  Procedure Laterality Date   CESAREAN SECTION     CHOLECYSTECTOMY      OB History   No obstetric history on file.      Home Medications    Prior to Admission medications   Medication Sig Start Date End Date Taking? Authorizing Provider  BD PEN NEEDLE NANO 2ND GEN 32G X 4 MM MISC Inject 1 Product into the skin Nightly. 09/11/19   [provider]  FARXIGA 10 MG TABS tablet Take 10 mg by mouth daily. 09/26/19   [provider]  fluticasone (FLONASE) 50 MCG/ACT nasal spray Place 1 spray into both nostrils 2 (two) times daily. 10/25/19 12/24/19  Betancourt, Jarold Song, NP  glucose blood (ONETOUCH VERIO) test strip Use as instructed 10/06/18   Betancourt, Jarold Song, NP  indomethacin (INDOCIN) 50 MG capsule Take 1 capsule (50 mg total) by mouth 3 (three) times daily with meals. 11/04/19   Cathie Hoops, Lylee Corrow V, PA-C  LANTUS SOLOSTAR 100 UNIT/ML Solostar Pen Inject 40 Units into the skin daily after supper. 09/09/19   [provider]  OneTouch Delica Lancets 30G MISC 1 Units by Does not apply route 2 (two) times a day. 10/06/18   Betancourt, Jarold Song, NP  simvastatin  (ZOCOR) 5 MG tablet Take 40 mg by mouth daily.     [provider]  sodium chloride (OCEAN) 0.65 % SOLN nasal spray Place 2 sprays into both nostrils every 2 (two) hours while awake. 10/25/19 11/24/19  Betancourt, Jarold Song, NP  triamterene-hydrochlorothiazide (DYAZIDE) 37.5-25 MG capsule Take 1 capsule by mouth daily.    [provider]    Family History Family History  Problem Relation Age of Onset   Diabetes Mother    Hyperlipidemia Mother    Diabetes Father    Heart disease Father    Parkinson's disease Father    Diabetes Brother    Breast cancer Neg Hx     Social History Social History   Tobacco Use   Smoking status: Passive Smoke Exposure - Never Smoker   Smokeless tobacco: Never Used  Substance Use Topics   Alcohol use: No   Drug use: No     Allergies   Patient has no known allergies.   Review of Systems Review of Systems  Reason unable to perform ROS: See HPI as above.     Physical Exam Triage Vital Signs ED Triage Vitals  Enc Vitals Group     BP 11/04/19 1046 133/80     Pulse Rate 11/04/19 1046 85     Resp 11/04/19 1046  18     Temp 11/04/19 1046 98.2 F (36.8 C)     Temp Source 11/04/19 1046 Oral     SpO2 11/04/19 1046 96 %     Weight --      Height --      Head Circumference --      Peak Flow --      Pain Score 11/04/19 1048 5     Pain Loc --      Pain Edu? --      Excl. in GC? --    No data found.  Updated Vital Signs BP 133/80 (BP Location: Left Arm)    Pulse 85    Temp 98.2 F (36.8 C) (Oral)    Resp 18    SpO2 96%   Physical Exam Constitutional:      General: She is not in acute distress.    Appearance: Normal appearance. She is well-developed. She is not toxic-appearing or diaphoretic.  HENT:     Head: Normocephalic and atraumatic.  Eyes:     Conjunctiva/sclera: Conjunctivae normal.     Pupils: Pupils are equal, round, and reactive to light.  Pulmonary:     Effort: Pulmonary effort is normal. No  respiratory distress.  Musculoskeletal:     Cervical back: Normal range of motion and neck supple.     Comments: Swelling to the right medial aspect of foot, most significant to the 1st MTP joint. No obvious erythema. No warmth. Minimal tenderness to palpation. Full ROM of toe, though with pain. NVI  Skin:    General: Skin is warm and dry.  Neurological:     Mental Status: She is alert and oriented to person, place, and time.      UC Treatments / Results  Labs (all labs ordered are listed, but only abnormal results are displayed) Labs Reviewed - No data to display  EKG   Radiology No results found.  Procedures Procedures (including critical care time)  Medications Ordered in UC Medications - No data to display  Initial Impression / Assessment and Plan / UC Course  I have reviewed the triage vital signs and the nursing notes.  Pertinent labs & imaging results that were available during my care of the patient were reviewed by me and considered in my medical decision making (see chart for details).    Given uncontrolled DM, will defer prednisone at this time.  We will switch naproxen to indomethacin.  Discussed current history and exam more consistent with tendinitis versus gout pain.  Continue ice, elevation.  Patient with established podiatrist, to follow-up if symptoms not improving.  Return precautions given. . Final Clinical Impressions(s) / UC Diagnoses   Final diagnoses:  Great toe pain, right    ED Prescriptions    Medication Sig Dispense Auth. Provider   indomethacin (INDOCIN) 50 MG capsule Take 1 capsule (50 mg total) by mouth 3 (three) times daily with meals. 15 capsule Belinda Fisher, PA-C     PDMP not reviewed this encounter.   Belinda Fisher, PA-C 11/04/19 1157

## 2019-11-04 NOTE — Discharge Instructions (Signed)
Switch to indomethacin as directed. Continue ice compress, elevation. Follow up with podiatrist if symptoms not improving.

## 2019-11-04 NOTE — ED Triage Notes (Signed)
Pt here for right great toe pain and swelling x 5 days; pt seen here Wednesday for same and given naproxen without relief

## 2019-11-14 ENCOUNTER — Ambulatory Visit: Payer: Self-pay | Admitting: Registered Nurse

## 2019-11-14 ENCOUNTER — Other Ambulatory Visit: Payer: Self-pay

## 2019-11-14 VITALS — BP 130/95 | HR 75 | Temp 97.3°F

## 2019-11-14 DIAGNOSIS — S90421A Blister (nonthermal), right great toe, initial encounter: Secondary | ICD-10-CM

## 2019-11-14 DIAGNOSIS — I10 Essential (primary) hypertension: Secondary | ICD-10-CM | POA: Insufficient documentation

## 2019-11-14 MED ORDER — AQUAPHOR EX OINT: TOPICAL_OINTMENT | CUTANEOUS | 0 refills | Status: DC | PRN

## 2019-11-14 NOTE — Progress Notes (Signed)
Subjective:    Patient ID: Vanessa Sandoval, female    DOB: [birth year], 63 y.o.   MRN: [id number]  63y/o Caucasian established female pt c/o blister on R great toe that she noticed this morning. Blister not painful when resting without shoes  Was seen at urgent care twice due to right great toe joint pain in the previous 2 weeks (11/01/2019 and 08/07).  Treated with NSAID didn't help (naproxen) then prescribed indomethacin which has resolved toe joint pain but then blister appeared.  Has 1 dose left to take of indomethacin.  Wearing slippers today as closed toe shoes press on blister too much and it hurts more.  Patient reported she always wears socks in shoes.  Was on vacation earlier this month at the beach but denied trauma/insect bites.  Denied history of gout. 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     Review of Systems  Constitutional: Negative for activity change, appetite change, chills, diaphoresis, fatigue, fever and unexpected weight change.  HENT: Negative for trouble swallowing and voice change.   Eyes: Negative for photophobia, pain, discharge, redness, itching and visual disturbance.  Respiratory: Negative for cough, shortness of breath, wheezing and stridor.   Cardiovascular: Negative for chest pain.  Gastrointestinal: Negative for diarrhea, nausea and vomiting.  Endocrine: Negative for cold intolerance and heat intolerance.  Genitourinary: Negative for difficulty urinating.  Musculoskeletal: Negative for arthralgias, back pain, gait problem, joint swelling, myalgias, neck pain and neck stiffness.  Skin: Positive for rash. Negative for color change, pallor and wound.  Allergic/Immunologic: Positive for environmental allergies and immunocompromised state. Negative for food allergies.  Neurological: Negative for dizziness, tremors, seizures, syncope, facial asymmetry, speech difficulty, weakness, light-headedness, numbness and headaches.   Psychiatric/Behavioral: Negative for agitation, confusion and sleep disturbance.       Objective:   Physical Exam Vitals and nursing note reviewed.  Constitutional:      General: She is awake. She is not in acute distress.    Appearance: Normal appearance. She is well-developed and well-groomed. She is obese. She is not ill-appearing, toxic-appearing or diaphoretic.  HENT:     Head: Normocephalic and atraumatic.     Jaw: There is normal jaw occlusion.     Salivary Glands: Right salivary gland is not diffusely enlarged. Left salivary gland is not diffusely enlarged.     Right Ear: Hearing and external ear normal.     Left Ear: Hearing and external ear normal.     Nose: Nose normal.     Mouth/Throat:     Mouth: No angioedema.     Pharynx: Oropharynx is clear.  Eyes:     General: Lids are normal. Vision grossly intact. Gaze aligned appropriately. Allergic shiner present. No visual field deficit or scleral icterus.       Right eye: No discharge.        Left eye: No discharge.     Extraocular Movements: Extraocular movements intact.     Conjunctiva/sclera: Conjunctivae normal.     Pupils: Pupils are equal, round, and reactive to light.  Neck:     Thyroid: No thyromegaly.     Trachea: Trachea and phonation normal.  Cardiovascular:     Rate and Rhythm: Normal rate and regular rhythm.     Pulses: Normal pulses.          Dorsalis pedis pulses are 2+ on the right side and 2+ on the left side.  Pulmonary:     Effort: Pulmonary  effort is normal. No respiratory distress.     Breath sounds: Normal breath sounds and air entry. No stridor or transmitted upper airway sounds. No wheezing, rhonchi or rales.     Comments: Spoke full sentences without difficulty; no cough observed in exam room; wearing mask due to covid 19 pandemic Abdominal:     Palpations: Abdomen is soft.  Musculoskeletal:        General: No swelling, deformity or signs of injury. Normal range of motion.     Right shoulder:  Normal.     Right elbow: Normal.     Left elbow: Normal.     Right hand: Normal.     Left hand: Normal.     Cervical back: Normal, normal range of motion and neck supple. No swelling, edema, deformity, erythema, signs of trauma, lacerations, rigidity, torticollis or crepitus. No pain with movement. Normal range of motion.     Thoracic back: Normal.     Lumbar back: Normal.     Right hip: Normal.     Left hip: Normal.     Right knee: Normal.     Left knee: Normal.     Right lower leg: Normal. No edema.     Left lower leg: Normal. No edema.     Right ankle: Normal.     Left ankle: Normal.     Right foot: Normal range of motion and normal capillary refill. Tenderness present. No swelling, deformity, bunion, Charcot foot, foot drop, prominent metatarsal heads, laceration, bony tenderness or crepitus. Normal pulse.     Left foot: Normal.       Feet:     Comments: Blister right 1st toe TTP; normal AROM all toes/feet  Feet:     Right foot:     Skin integrity: Blister, callus and dry skin present. No ulcer, skin breakdown, erythema, warmth or fissure.     Toenail Condition: Right toenails are normal.     Left foot:     Skin integrity: Callus and dry skin present. No ulcer, blister, skin breakdown, erythema, warmth or fissure.     Toenail Condition: Left toenails are normal.     Comments: Bilaterally plantar callous on toes and calcaneous; skin dry mild scaling bilateral feet plantar; 2cm blister right 1st toe dorsum overlying DIP joint but not touching or extending into nail bed/cuticle Lymphadenopathy:     Head:     Right side of head: No submental, submandibular, tonsillar or preauricular adenopathy.     Left side of head: No submental, submandibular, tonsillar or preauricular adenopathy.     Cervical: No cervical adenopathy.     Right cervical: No superficial cervical adenopathy.    Left cervical: No superficial cervical adenopathy.  Skin:    General: Skin is warm and dry.      Capillary Refill: Capillary refill takes less than 2 seconds.     Coloration: Skin is not ashen, cyanotic, jaundiced, mottled, pale or sallow.     Findings: Lesion and rash present. No abrasion, abscess, acne, bruising, burn, ecchymosis, erythema, signs of injury, laceration, petechiae or wound. Rash is vesicular. Rash is not crusting, macular, nodular, papular, purpuric, pustular, scaling or urticarial.     Nails: There is no clubbing.          Comments: Full AROM right toe and bilateral toes equal; patient does not have bony tenderness any toes and skin dry bilateral feet  Neurological:     General: No focal deficit present.     Mental  Status: She is alert and oriented to person, place, and time. Mental status is at baseline.     GCS: GCS eye subscore is 4. GCS verbal subscore is 5. GCS motor subscore is 6.     Cranial Nerves: Cranial nerves are intact. No cranial nerve deficit, dysarthria or facial asymmetry.     Sensory: Sensation is intact. No sensory deficit.     Motor: Motor function is intact. No weakness, tremor, atrophy, abnormal muscle tone or seizure activity.     Coordination: Coordination is intact. Coordination normal.     Gait: Gait is intact. Gait normal.     Comments: Gait sure and steady in clinic; on/off exam table without difficulty and in/out of chair without difficulty; bilateral hand grasp equal 5/5  Psychiatric:        Attention and Perception: Attention and perception normal.        Mood and Affect: Mood and affect normal.        Speech: Speech normal.        Behavior: Behavior normal. Behavior is cooperative.        Thought Content: Thought content normal.        Cognition and Memory: Cognition and memory normal.        Judgment: Judgment normal.           Assessment & Plan:  A-blister right 1st toe initial visit, hypertension  P-Exitcare handout on blister and corns/callouses printed and given to patient.  Do not pop blister.  Apply moleskin around  blister but not over top of blister discussed cutting rectangle that is large enough to keep 2nd toe off 1st toe and wrap medial to laterally around toe with center cut out to fit around blister.  Wear socks in shoes.  Do not wear tight fitting shoes or shoes that rub blister/skin.  Discussed diabetic foot care--daily foot checks and applying emollient e.g. aquaphor/vaseline daily.   RTC if new erythema, worsening pain, purulent discharge, and/or fever. Wash area with soap and water at least daily.  If blister pops do not remove skin but apply triple antibiotic (neosporin) or aquaphor/vaseline to give protective coating and then apply bandaid over top until healed and monitor for signs of infection (redness/swelling/worsening pain/purulent discharge) notify clinic staff if these occur as may need to start antibiotic.  Patient verbalized understanding, agreed with plan of care and had no further questions at this time.   Elevated blood pressure taking her medication.  Acute pain.  133/80 at last urgent care visit.  Had caffeine today.  Will have patient follow up with RN Rolly Salter next week for repeat BP check.  ER if difficulty breathing, chest pain, worst headache of her life or visual changes.  Patient verbalized understanding information/instructions, agreed with plan of care and had no further questions at this time.

## 2019-11-14 NOTE — Patient Instructions (Signed)
Corns and Calluses Corns are small areas of thickened skin that occur on the top, sides, or tip of a toe. They contain a cone-shaped core with a point that can press on a nerve below. This causes pain.  Calluses are areas of thickened skin that can occur anywhere on the body, including the hands, fingers, palms, soles of the feet, and heels. Calluses are usually larger than corns. What are the causes? Corns and calluses are caused by rubbing (friction) or pressure, such as from shoes that are too tight or do not fit properly. What increases the risk? Corns are more likely to develop in people who have misshapen toes (toe deformities), such as hammer toes. Calluses can occur with friction to any area of the skin. They are more likely to develop in people who:  Work with their hands.  Wear shoes that fit poorly, are too tight, or are high-heeled.  Have toe deformities. What are the signs or symptoms? Symptoms of a corn or callus include:  A hard growth on the skin.  Pain or tenderness under the skin.  Redness and swelling.  Increased discomfort while wearing tight-fitting shoes, if your feet are affected. If a corn or callus becomes infected, symptoms may include:  Redness and swelling that gets worse.  Pain.  Fluid, blood, or pus draining from the corn or callus. How is this diagnosed? Corns and calluses may be diagnosed based on your symptoms, your medical history, and a physical exam. How is this treated? Treatment for corns and calluses may include:  Removing the cause of the friction or pressure. This may involve: ? Changing your shoes. ? Wearing shoe inserts (orthotics) or other protective layers in your shoes, such as a corn pad. ? Wearing gloves.  Applying medicine to the skin (topical medicine) to help soften skin in the hardened, thickened areas.  Removing layers of dead skin with a file to reduce the size of the corn or callus.  Removing the corn or callus with a  scalpel or laser.  Taking antibiotic medicines, if your corn or callus is infected.  Having surgery, if a toe deformity is the cause. Follow these instructions at home:   Take over-the-counter and prescription medicines only as told by your health care provider.  If you were prescribed an antibiotic, take it as told by your health care provider. Do not stop taking it even if your condition starts to improve.  Wear shoes that fit well. Avoid wearing high-heeled shoes and shoes that are too tight or too loose.  Wear any padding, protective layers, gloves, or orthotics as told by your health care provider.  Soak your hands or feet and then use a file or pumice stone to soften your corn or callus. Do this as told by your health care provider.  Check your corn or callus every day for symptoms of infection. Contact a health care provider if you:  Notice that your symptoms do not improve with treatment.  Have redness or swelling that gets worse.  Notice that your corn or callus becomes painful.  Have fluid, blood, or pus coming from your corn or callus.  Have new symptoms. Summary  Corns are small areas of thickened skin that occur on the top, sides, or tip of a toe.  Calluses are areas of thickened skin that can occur anywhere on the body, including the hands, fingers, palms, and soles of the feet. Calluses are usually larger than corns.  Corns and calluses are caused by  rubbing (friction) or pressure, such as from shoes that are too tight or do not fit properly.  Treatment may include wearing any padding, protective layers, gloves, or orthotics as told by your health care provider. This information is not intended to replace advice given to you by your health care provider. Make sure you discuss any questions you have with your health care provider. Document Revised: 07/06/2018 Document Reviewed: 01/27/2017 Elsevier Patient Education  2020 Elsevier Inc. Blisters, Adult  A  blister is a raised bubble of skin filled with liquid. Blisters often develop in an area of the skin that repeatedly rubs or presses against another surface (friction blister). Friction blisters can occur on any part of the body, but they usually develop on the hands or feet. Long-term pressure on the same area of the skin can also lead to areas of hardened skin (calluses). What are the causes? A blister can be caused by:  An injury.  A burn.  An allergic reaction.  An infection.  Exposure to irritating chemicals.  Friction, especially in an area with a lot of heat and moisture. Friction blisters often result from:  Sports.  Repetitive activities.  Using tools and doing other activities without wearing gloves.  Shoes that are too tight or too loose. What are the signs or symptoms? A blister is often round and looks like a bump. It may:  Itch.  Be painful to the touch. Before a blister forms, the skin may:  Become red.  Feel warm.  Itch.  Be painful to the touch. How is this diagnosed? A blister is diagnosed with a physical exam. How is this treated? Treatment usually involves protecting the area where the blister has formed until the skin has healed. Other treatments may include:  A bandage (dressing) to cover the blister.  Extra padding around and over the blister, so that it does not rub on anything.  Antibiotic ointment. Most blisters break open, dry up, and go away on their own within 1-2 weeks. Blisters that are very painful may be drained before they break open on their own. If the blister is large or painful, it can be drained by: 1. Sterilizing a small needle with rubbing alcohol. 2. Washing your hands with soap and water. 3. Inserting the needle in the edge of the blister to make a small hole. Some fluid will drain out of the hole. Let the top or roof of the blister stay in place. This helps the skin heal. 4. Washing the blister with mild soap and  water. 5. Covering the blister with antibiotic ointment, if prescribed by your health care provider, and a dressing. Some blisters may need to be drained by a health care provider. Follow these instructions at home:  Protect the area where the blister has formed as told by your health care provider.  Keep your blister clean and dry. This helps to prevent infection.  Do not pop your blister. This can cause infection.  If you were prescribed an antibiotic, use it as told by your health care provider. Do not stop using the antibiotic even if your condition improves.  Wear different shoes until the blister heals.  Avoid the activity that caused the blister until your blister heals.  Check your blister every day for signs of infection. Check for: ? More redness, swelling, or pain. ? More fluid or blood. ? Warmth. ? Pus or a bad smell. ? The blister getting better and then getting worse. How is this prevented? Taking  these steps can help to prevent blisters that are caused by friction. Make sure you:  Wear comfortable shoes that fit well.  Always wear socks with shoes.  Wear extra socks or use tape, bandages, or pads over blister-prone areas as needed. You may also apply petroleum jelly under bandages in blister-prone areas.  Wear protective gear, such as gloves, when participating in sports or activities that can cause blisters.  Wear loose-fitting, moisture-wicking clothes when participating in sports or activities.  Use powders as needed to keep your feet dry. Contact a health care provider if:  You have more redness, swelling, or pain around your blister.  You have more fluid or blood coming from your blister.  Your blister feels warm to the touch.  You have pus or a bad smell coming from your blister.  You have a fever or chills.  Your blister gets better and then it gets worse. This information is not intended to replace advice given to you by your health care  provider. Make sure you discuss any questions you have with your health care provider. Document Revised: 02/26/2017 Document Reviewed: 09/27/2015 Elsevier Patient Education  2020 [employer].

## 2019-12-06 ENCOUNTER — Other Ambulatory Visit: Payer: Self-pay

## 2019-12-06 ENCOUNTER — Encounter: Payer: Self-pay | Admitting: Podiatry

## 2019-12-06 ENCOUNTER — Other Ambulatory Visit: Payer: Self-pay | Admitting: Podiatry

## 2019-12-06 ENCOUNTER — Ambulatory Visit (INDEPENDENT_AMBULATORY_CARE_PROVIDER_SITE_OTHER): Payer: PRIVATE HEALTH INSURANCE

## 2019-12-06 ENCOUNTER — Ambulatory Visit: Payer: PRIVATE HEALTH INSURANCE | Admitting: Podiatry

## 2019-12-06 DIAGNOSIS — M7751 Other enthesopathy of right foot: Secondary | ICD-10-CM

## 2019-12-06 DIAGNOSIS — M779 Enthesopathy, unspecified: Secondary | ICD-10-CM

## 2019-12-06 DIAGNOSIS — M109 Gout, unspecified: Secondary | ICD-10-CM

## 2019-12-06 DIAGNOSIS — M79671 Pain in right foot: Secondary | ICD-10-CM

## 2019-12-07 NOTE — Progress Notes (Signed)
Subjective:   Patient ID: Vanessa Sandoval, female   DOB: 64 y.o.   MRN: [id number]   HPI Patient states she is getting a lot of pain on the inside of the big toe joint right and states it is been in about a month. She does have family history of gout and does have pain associated with this   ROS      Objective:  Physical Exam  Neurovascular status intact with patient found to have discomfort mostly in the plantar medial aspect of the first metatarsal right with no indication structural bunion no cherry red appearance but quite tender around this area. No history of injury or changes in activity levels     Assessment:  Inflammatory capsulitis with the possibility that there could be systemic gout condition     Plan:  H&P reviewed condition. Sterile prep done and I did inject the plantar medial capsule 3 mg Kenalog 5 mg Xylocaine and I advised on soaks therapy topical medications. If symptoms persist I want to see her back we cannot exclude the possibility of gout  X-rays indicate there is no indications of structural bunion or indications of hallux limitus rigidus condition

## 2020-04-03 ENCOUNTER — Telehealth: Payer: Self-pay | Admitting: Registered Nurse

## 2020-04-03 ENCOUNTER — Encounter: Payer: Self-pay | Admitting: Registered Nurse

## 2020-04-03 DIAGNOSIS — U071 COVID-19: Secondary | ICD-10-CM

## 2020-04-03 NOTE — Telephone Encounter (Signed)
HR manager Lincoln Maxin notified NP that pt reported symptoms and sent home from work and for clinic staff to follow up with patient.  Patient contacted via telephone and reported scratchy throat started Saturday1/03/2020 Monday 04/01/2020 congestion and yesterday cough worst.  She has scheduled appt for testing [facility] 12 Jan in [place] was unable to find anything sooner.  Attempted to book with [facility] but website states no appts available every suspect down at time of call.  Discussed with patient testing 12 days after symptoms typically could result in negative test with omicron variant due to viral load peaking around day 5 then quickly dropping off for most persons with current research.  Denied known covid exposures.  Her fob not working so unable to work remote at this time.  Day 0 03/30/2020  Day 5 04/04/2020  Day 10 04/09/2020.  Booster completed 12 Dec, using flonase, nasal saline and Halls menthol cough lozenges OTC.  She stated cough much improved today.    Discussed stay home quarantine will call her back once I am able to find alternative testing site with earlier date recommended today/tomorrow.  Lives in [place] preferred test location.    Reviewed possible Covid sx including cough, shortness of breath with exertion or at rest, runny nose, congestion, sinus pain/pressure, sore throat, fever/chills, body aches, fatigue, loss of taste/smell, GI symptoms of nausea/vomiting/diarrhea. Also reviewed same day/emergent eval/ER precautions of dizziness/syncope, confusion, blue tint to lips/face, severe shortness of breath/difficulty breathing/wheezing.   Patient to isolate in own room and if possible use only one bathroom if living with others in home.  Wear mask when out of room to help prevent spread to others in household.   Pt verbalized understanding and agreement with plan of care. No further questions/concerns at this time. Pt reminded to contact clinic with any changes in sx or  questions/concerns. A&Ox3.  Respirations even and unlabored some nasal congestion noted during call.  No cough audible/throat clearing.  spoke full sentences without difficulty.  Call duration   HR notified quarantine dates/test date currently scheduled and assisting patient with rescheduling.

## 2020-04-04 NOTE — Telephone Encounter (Signed)
RN attempted to contact pt to check sx and verify RTW scheduled for tomorrow. No answer. LVMRCB. Explained clinic closing shortly and she can email RN with sx update for clearance for tomorrow but if she does not receive return email or clearance, do not report to work.

## 2020-04-04 NOTE — Telephone Encounter (Signed)
Patient contacted and notified appts available in [facility] for Fri or Sat.  Patient preferred [facility] site [address] [place] [address] 06 Apr 2020.  Patient may cancel her 12 Jan [facility] appt for covid testing.  Patient reported cough improved this am feeling well.  No cough/nasal sniffing or throat clearing noted during telephone conversation.  HR notified of updated test date.  Patient verbalized understanding information/instructions, agreed with plan of care and had no further questions at this time.

## 2020-04-04 NOTE — Telephone Encounter (Signed)
Noted patient did not answer phone this afternoon to verify symptoms continued improved for RTW tomorrow.

## 2020-04-05 NOTE — Telephone Encounter (Signed)
Attempted to contact pt by phone. No answer. LVMRCB and gave email address since clinic closing for the day shortly.

## 2020-04-05 NOTE — Telephone Encounter (Signed)
Noted will follow up with patient via telephone tomorrow.

## 2020-04-06 ENCOUNTER — Other Ambulatory Visit: Payer: Self-pay

## 2020-04-06 ENCOUNTER — Other Ambulatory Visit: Payer: No Typology Code available for payment source

## 2020-04-06 DIAGNOSIS — Z20822 Contact with and (suspected) exposure to covid-19: Secondary | ICD-10-CM

## 2020-04-06 NOTE — Telephone Encounter (Signed)
Patient returned call stated had covid test this morning.  Cough resolved.  Congestion almost gone.  Plans to return to work 04/08/2020 per supervisor  A&Ox3 respirations even and unlabored on RA spoke full sentences without difficulty.  No cough/nasal sniffing/throat clearing during telephone conversation.  No further questions or concerns at this time.

## 2020-04-06 NOTE — Telephone Encounter (Signed)
Patient scheduled for covid testing today.  Left message to return my call to [phone]  No voicemail but it will tell me she called.  I may be on the phone with another patient but would return her call as soon as possible.  Checking in to see if symptoms resolved and if test performed or further questions or concerns.

## 2020-04-08 NOTE — Telephone Encounter (Signed)
Noted RTW today as expected and strict mask use through 04/09/2020

## 2020-04-08 NOTE — Telephone Encounter (Signed)
Confirmed pt did RTW today as planned. She feels well. No concerns. Reminded of strict mask use thru 04/09/20. Closing encounter.

## 2020-04-09 LAB — NOVEL CORONAVIRUS, NAA: SARS-CoV-2, NAA: DETECTED — AB

## 2020-04-10 NOTE — Telephone Encounter (Signed)
Called pt to confirm sx still resolved as they were on 1/10 when RN spoke with pt. She received positive test results from 1/8 test per HR and dept had her work remote today until clinic touched base with her. She was onsite 1/10 and 1/11. She has completed 10 day quarantine as of yesterday, 1/11 and is cleared to work on-site as long as sx have not recurred.  LVMRCB to RN [phone] or Inetta Fermo NP would be calling later this evening from [phone].

## 2020-04-10 NOTE — Telephone Encounter (Signed)
Spoke with patient via telephone denied symptoms feeling well working remote today as supervisor asked her to after she was notified of positive covid test.  Day 0 1/1/20222  Completed 10 day quarantine and may return to work wearing mask.  HR notified and patient verbalized understanding information/instructions, agreed with plan of care and had no further questions at this time.  A&Ox3 respirations even and unlabored no cough/nasal sniffing/throat clearing or nasal congestion noted.  Spoke full sentences without difficulty.

## 2020-07-26 ENCOUNTER — Other Ambulatory Visit: Payer: Self-pay

## 2020-07-26 ENCOUNTER — Ambulatory Visit: Payer: Self-pay | Admitting: *Deleted

## 2020-07-26 VITALS — BP 120/86 | HR 73 | Ht 65.0 in | Wt 185.0 lb

## 2020-07-26 DIAGNOSIS — Z0000 Encounter for general adult medical examination without abnormal findings: Secondary | ICD-10-CM

## 2020-07-26 NOTE — Progress Notes (Signed)
[employer] insurance premium discount evaluation: Labs Drawn. Replacements ROI form signed. Tobacco Free Attestation form signed.  Forms placed in paper chart.  

## 2020-07-27 LAB — CMP12+LP+TP+TSH+6AC+CBC/D/PLT
ALT: 15 IU/L (ref 0–32)
AST: 15 IU/L (ref 0–40)
Albumin/Globulin Ratio: 1.2 (ref 1.2–2.2)
Albumin: 4.3 g/dL (ref 3.8–4.8)
Alkaline Phosphatase: 103 IU/L (ref 44–121)
BUN/Creatinine Ratio: 29 — ABNORMAL HIGH (ref 12–28)
BUN: 40 mg/dL — ABNORMAL HIGH (ref 8–27)
Basophils Absolute: 0.1 10*3/uL (ref 0.0–0.2)
Basos: 1 %
Bilirubin Total: 0.3 mg/dL (ref 0.0–1.2)
Calcium: 10.1 mg/dL (ref 8.7–10.3)
Chloride: 97 mmol/L (ref 96–106)
Chol/HDL Ratio: 4.9 ratio — ABNORMAL HIGH (ref 0.0–4.4)
Cholesterol, Total: 240 mg/dL — ABNORMAL HIGH (ref 100–199)
Creatinine, Ser: 1.38 mg/dL — ABNORMAL HIGH (ref 0.57–1.00)
EOS (ABSOLUTE): 0.1 10*3/uL (ref 0.0–0.4)
Eos: 1 %
Estimated CHD Risk: 1.3 times avg. — ABNORMAL HIGH (ref 0.0–1.0)
Free Thyroxine Index: 2.4 (ref 1.2–4.9)
GGT: 12 IU/L (ref 0–60)
Globulin, Total: 3.5 g/dL (ref 1.5–4.5)
Glucose: 132 mg/dL — ABNORMAL HIGH (ref 65–99)
HDL: 49 mg/dL (ref >39)
Hematocrit: 46.7 % — ABNORMAL HIGH (ref 34.0–46.6)
Hemoglobin: 16.6 g/dL — ABNORMAL HIGH (ref 11.1–15.9)
Immature Grans (Abs): 0 10*3/uL (ref 0.0–0.1)
Immature Granulocytes: 0 %
Iron: 97 ug/dL (ref 27–139)
LDH: 205 IU/L (ref 119–226)
LDL Chol Calc (NIH): 153 mg/dL — ABNORMAL HIGH (ref 0–99)
Lymphocytes Absolute: 1.9 10*3/uL (ref 0.7–3.1)
Lymphs: 39 %
MCH: 31.7 pg (ref 26.6–33.0)
MCHC: 35.5 g/dL (ref 31.5–35.7)
MCV: 89 fL (ref 79–97)
Monocytes Absolute: 0.4 10*3/uL (ref 0.1–0.9)
Monocytes: 7 %
Neutrophils Absolute: 2.6 10*3/uL (ref 1.4–7.0)
Neutrophils: 52 %
Phosphorus: 4.2 mg/dL (ref 3.0–4.3)
Platelets: 291 10*3/uL (ref 150–450)
Potassium: 3.2 mmol/L — ABNORMAL LOW (ref 3.5–5.2)
RBC: 5.24 x10E6/uL (ref 3.77–5.28)
RDW: 12.3 % (ref 11.7–15.4)
Sodium: 141 mmol/L (ref 134–144)
T3 Uptake Ratio: 26 % (ref 24–39)
T4, Total: 9.2 ug/dL (ref 4.5–12.0)
TSH: 2.69 u[IU]/mL (ref 0.450–4.500)
Total Protein: 7.8 g/dL (ref 6.0–8.5)
Triglycerides: 207 mg/dL — ABNORMAL HIGH (ref 0–149)
Uric Acid: 10 mg/dL — ABNORMAL HIGH (ref 3.0–7.2)
VLDL Cholesterol Cal: 38 mg/dL (ref 5–40)
WBC: 5 10*3/uL (ref 3.4–10.8)
eGFR: 43 mL/min/{1.73_m2} — ABNORMAL LOW (ref >59)

## 2020-07-27 LAB — HGB A1C W/O EAG: Hgb A1c MFr Bld: 11.6 % — ABNORMAL HIGH (ref 4.8–5.6)

## 2020-07-28 ENCOUNTER — Encounter: Payer: Self-pay | Admitting: Registered Nurse

## 2020-07-28 DIAGNOSIS — Z6830 Body mass index (BMI) 30.0-30.9, adult: Secondary | ICD-10-CM

## 2020-07-28 DIAGNOSIS — E1169 Type 2 diabetes mellitus with other specified complication: Secondary | ICD-10-CM

## 2020-07-28 DIAGNOSIS — E876 Hypokalemia: Secondary | ICD-10-CM

## 2020-07-28 DIAGNOSIS — E1122 Type 2 diabetes mellitus with diabetic chronic kidney disease: Secondary | ICD-10-CM

## 2020-07-28 DIAGNOSIS — E785 Hyperlipidemia, unspecified: Secondary | ICD-10-CM

## 2020-07-28 DIAGNOSIS — Z794 Long term (current) use of insulin: Secondary | ICD-10-CM

## 2020-07-28 DIAGNOSIS — E790 Hyperuricemia without signs of inflammatory arthritis and tophaceous disease: Secondary | ICD-10-CM

## 2020-07-28 DIAGNOSIS — I10 Essential (primary) hypertension: Secondary | ICD-10-CM

## 2020-07-29 MED ORDER — CONTOUR TEST VI STRP: ORAL_STRIP | 12 refills | Status: AC

## 2020-07-29 MED ORDER — FREESTYLE LANCETS MISC: 12 refills | Status: AC

## 2020-07-29 NOTE — Progress Notes (Signed)
Results reviewed with pt in clinic. She reports being aware of kidney function. Sts her pcp discussed sending her to Neph[facility] bu numbers improved so she didn't. She recognizes that her diet is the cause of the increased A1c and that is has been treading upwards at pcp office as well. Discussed in detail diet and exercise recommendations r/t lipids, A1c, kidney function and potassium. She denies any hypokalemia sx or causes of hypokalemia other than diet. She plans to f/u with pcp to discuss repeat labs and plan of care. Performed med rec with pt. She is only taking Farxiga, Lantus, Simvastatin and Triamterene/HCTZ. She would like the referral to dietitian placed as she has not seen one previously. She prefers the [place] area.

## 2020-07-29 NOTE — Addendum Note (Signed)
Addended by: Loraine Grip on: 07/29/2020 11:00 AM   Modules accepted: Orders

## 2020-07-30 NOTE — Telephone Encounter (Signed)
Patient requested dietitian referral to network provider in [place] with RN [facility] yesterday.  Referral sent today for patient [facility] electronically.

## 2020-09-13 ENCOUNTER — Other Ambulatory Visit: Payer: Self-pay

## 2020-09-13 ENCOUNTER — Encounter: Payer: No Typology Code available for payment source | Attending: Registered Nurse | Admitting: Dietician

## 2020-09-13 ENCOUNTER — Encounter: Payer: Self-pay | Admitting: Dietician

## 2020-09-13 DIAGNOSIS — Z794 Long term (current) use of insulin: Secondary | ICD-10-CM | POA: Diagnosis present

## 2020-09-13 DIAGNOSIS — E1122 Type 2 diabetes mellitus with diabetic chronic kidney disease: Secondary | ICD-10-CM | POA: Insufficient documentation

## 2020-09-13 NOTE — Patient Instructions (Addendum)
Make an eye appointment Check your feet daily No need to refrigerate the insulin pen that you are using. Continue to take the Lantus consistently.  Keep the pen you are using in your room.  Drink more water (enough to stay hydrated) Increase fiber (vegetables/beans) Consider salad for lunch rather than fast food  Treat blood sugar with 15 grams of fast acting sugar (1/2 cup juice or 4 glucose tabs, etc. Consider getting your vitamin D checked.  Consider a vitamin D supplement in the winter (1000-2000 units daily unless you are low and need more)  Aim to be active 30 minutes or more most days  Walk 20 minutes after most meals when able  Low impact burst training 3-4 times per week  - look on YouTube or dance  Eat more Non-Starchy Vegetables These include greens, broccoli, cauliflower, cabbage, carrots, beets, eggplant, peppers, squash and others. Minimize added sugars and refined grains Rethink what you drink.  Choose beverages without added sugar.  Look for 0 carbs on the label. See the list of whole grains below.  Find alternatives to usual sweet treats. Choose whole foods over processed. Make simple meals at home more often than eating out.  Tips to increase fiber in your diet: (All plants have fiber.  Eat a variety. There are more than are on this list.) Slowly increase the amount of fiber you eat to 25-35 grams per day.  (More is fine if you tolerate it.) Fiber from whole grains, nuts and seeds Quinoa, 1/2 cup = 5 grams Bulgur, 1/2 cup = 4.1 grams Popcorn, 3 cups = 3.6 grams Whole Wheat Spaghetti, 1/2 cup = 3.2 grams Barley, 1/2 cup = 3 grams Oatmeal, 1/2 cup = 2 grams Whole Wheat English Muffin = 3 grams Corn, 1/2 cup = 2.1 grams Brown Rice, 1/2 cup = 1.8 grams Flax seeds, 1 Tablespoon = 2.8 grams Chia seeds, 1 Tablespoon = 11 grams Almonds, 1 ounce = 3.5 grams fiber Fiber from legumes Kidney beans, 1/2 cup 7.9 grams Lentils, 1/2 cup = 7.8 grams Pinto beans, 1/2 cup =  7.7 grams Black beans, 1/2 cup = 7.6 grams Lima beans, 1/2 cup 6.4 grams Chick peas, 1/2 cup = 5.3 grams Black eyed peas, 1/2 cup = 4 grams Fiber from fruits and vegetables Pear, 6 grams Apple. 3.3 grams Raspberries or Blackberries, 3/4 cup = 6 grams Strawberries or Blueberries, 1 cup = 3.4 grams Baked sweet potato 3.8 grams fiber Baked potato with skin 4.4 grams  Peas, 1/2 cup = 4.4 grams  Spinach, 1/2 cup cooked = 3.5 grams  Avocado, 1/2 = 5 grams

## 2020-09-13 NOTE — Progress Notes (Addendum)
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 States that she loses weight only with uncontrolled blood sugar.  205 lbs about 2017.  She went on Weight Watchers but did not lose.  Disliked counting points. 175 lbs 2018  Clinical Medical Hx: Type 2 Diabetes, CKD stage 3, HTN, HLD, obesity, increased uric acid, hypokalemia Medications: see list to include Lantus 50 units q HS (takes in the am if she forgets at [facility]), Farxiga Labs: A1C 11.6 07/26/20 increased from 8/9%, BUN 40/ Creatinine 1.38, Potassium, 3.2, eGFR 43, Uric Acid 10, Phos 4.2, cholesterol 240, HDL 49, LDL 153, Triglycerides 207 Notable Signs/Symptoms: occasional fluid retention  Lifestyle & Dietary Hx Patient's daughter, son-in-law and 3 grandchildren live with her.  She does the shopping and cooking. Patient works for [employer] Does not usually eat fruit except melon  Estimated daily fluid intake: 40 oz daily Supplements: none Sleep: good 8-9 hours Stress / self-care: normal Current average weekly physical activity: none, "lazy", walks a lot at work  24-Hr Dietary Recall First Meal: skips Snack: none Second Meal: Wendy's (Jr. Cheeseburger, chicken nuggets),  McDonald's (6 chicken nuggets, apple), Taco Bell (ques[place]illa and taco) or Restaurant ([place] sandwich, potato chips) Snack: spoons of peanut butter Third Meal: steak and salad OR corn, squash, cucumbers (occasionally with meat), OR chicken and salad OR hamburger on a bun, baked beans  Snack: occasional peanut butter Beverages: 8-16 oz water daily,  unsweetened or sweet tea (24 oz daily), - no other fluid  Estimated Energy Needs Calories: 1300-1400 Protein: 50 g  NUTRITION DIAGNOSIS  NB-1.1 Food and nutrition-related knowledge deficit As related to balance of carbohydrates, protein, and fat.  As evidenced by diet hx and patient report.   NUTRITION INTERVENTION  Nutrition education (E-1) on the following topics:  Importance of adequate hydration Importance of yearly eye exam Reviewed medication, encouraged insulin site roation Discussed consistency of insulin and barriers.  She was storing this in the refrigerator and sometimes would not feel like getting this at night.  Discussed keeping this in her room. Treatment of hypoglycemia and to keep glucose options in her room and purse. Purpose and importance of daily foot check Benefits of fiber to help with weight, HTN, HLD, blood sugar control and sources of fiber Increased fruits, vegetables, legumes to increase potassium intake Insulin resistance and causes Alternative meal options to fast food Vitamin D sources and supplementation Benefits of exercise Blood glucose goals and importance of testing particularly while making lifestyle changes Rethinking her beverages to improve blood glucose control Plant protein options with recommendation to decrease meat intake to help decrease uric acid as well as saturated fat  Handouts Provided Include  How to Thrive:  A Guide for Your Journey with Diabetes by the ADA Meal Plan Card Diabetes Resources Low Purine nutrition therapy from AND Fiber list   Learning Style & Readiness for Change Teaching method utilized: Visual & Auditory  Demonstrated degree of understanding via: Teach Back  Barriers to learning/adherence to lifestyle change: motivation  Goals Established by Pt Rethink beverages Increase fiber Alternatives to eating out Exercise regularly  Recommendations: Make an  eye appointment Check your feet daily No need to  refrigerate the insulin pen that you are using. Continue to take the Lantus consistently.  Keep the pen you are using in your room.  Drink more water (enough to stay hydrated) Increase fiber (vegetables/beans) Consider salad for lunch rather than fast food  Treat blood sugar with 15 grams of fast acting sugar (1/2 cup juice or 4 glucose tabs, etc. Consider getting your vitamin D checked.  Consider a vitamin D supplement in the winter (1000-2000 units daily unless you are low and need more)  Aim to be active 30 minutes or more most days  Walk 20 minutes after most meals when able  Low impact burst training 3-4 times per week  - look on YouTube or dance   MONITORING & EVALUATION Dietary intake, weekly physical activity in 2 months.  Next Steps  Patient is to call for questions.

## 2020-10-30 ENCOUNTER — Other Ambulatory Visit: Payer: Self-pay

## 2020-10-30 ENCOUNTER — Encounter: Payer: Self-pay | Admitting: Emergency Medicine

## 2020-10-30 ENCOUNTER — Ambulatory Visit
Admission: EM | Admit: 2020-10-30 | Discharge: 2020-10-30 | Disposition: A | Discharge status: Home / Self Care | Payer: No Typology Code available for payment source | Attending: Family Medicine | Admitting: Family Medicine

## 2020-10-30 DIAGNOSIS — R2231 Localized swelling, mass and lump, right upper limb: Secondary | ICD-10-CM

## 2020-10-30 DIAGNOSIS — M79641 Pain in right hand: Secondary | ICD-10-CM | POA: Diagnosis not present

## 2020-10-30 MED ORDER — COLCHICINE 0.6 MG PO TABS: ORAL_TABLET | ORAL | 0 refills | Status: DC

## 2020-10-30 MED ORDER — INDOMETHACIN 50 MG PO CAPS: 50.0000 mg | ORAL_CAPSULE | Freq: Two times a day (BID) | ORAL | 0 refills | Status: DC

## 2020-10-30 NOTE — ED Provider Notes (Signed)
[facility]    CSN: [id number] Arrival date & time: 10/30/20  0849      History   Chief Complaint Chief Complaint  Patient presents with   Hand Problem    HPI Vanessa Sandoval is a 65 y.o. female.   Patient presenting today with 1 day history of sudden onset redness, swelling at third MCP with significant pain and stiffness.  No injury to the area, fevers, chills, skin lesions, numbness, tingling.  Tried some BC powder with no benefit.  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  States she thinks this is gout.   Past Medical History:  Diagnosis Date   Chronic kidney disease    Diabetes mellitus without complication (HCC)    Herpes    Hyperlipemia    Hypertension     Patient Active Problem List   Diagnosis Date Noted   Essential hypertension, benign 11/14/2019   Type 2 diabetes mellitus with chronic kidney disease, with long-term current use of insulin (HCC) 02/07/2013    Past Surgical History:  Procedure Laterality Date   CESAREAN SECTION     CHOLECYSTECTOMY      OB History   No obstetric history on file.      Home Medications    Prior to Admission medications   Medication Sig Start Date End Date Taking? Authorizing Provider  BD PEN NEEDLE NANO 2ND GEN 32G X 4 MM MISC Inject 1 Product into the skin Nightly. 09/11/19  Yes [provider]  colchicine 0.6 MG tablet Take 2 tabs at onset of pain, then may repeat 1 tab in 1 hour if persisting. Repeat daily until resolved 10/30/20  Yes Particia Nearing, PA-C  FARXIGA 10 MG TABS tablet Take 10 mg by mouth daily. 09/26/19  Yes [provider]  glucose blood (CONTOUR TEST) test strip Use as instructed 07/29/20  Yes Betancourt, Jarold Song, NP  Lancets (FREESTYLE) lancets Use as instructed 07/29/20  Yes Betancourt, Tina A, NP  LANTUS SOLOSTAR 100 UNIT/ML Solostar Pen Inject 40 Units into the skin daily after supper. 09/09/19  Yes [provider]   simvastatin (ZOCOR) 5 MG tablet Take 40 mg by mouth daily.    Yes [provider]  triamterene (DYRENIUM) 50 MG capsule Take 25 mg by mouth 2 (two) times daily.   Yes [provider]  triamterene-hydrochlorothiazide (DYAZIDE) 37.5-25 MG capsule Take 1 capsule by mouth daily.   Yes [provider]  fluticasone (FLONASE) 50 MCG/ACT nasal spray Place 1 spray into both nostrils 2 (two) times daily. Patient not taking: Reported on 07/29/2020 10/25/19 12/24/19  Barbaraann Barthel, NP  indomethacin (INDOCIN) 50 MG capsule Take 1 capsule (50 mg total) by mouth 2 (two) times daily with a meal. 10/30/20   Particia Nearing, PA-C  mineral oil-hydrophilic petrolatum (AQUAPHOR) ointment Apply topically as needed for dry skin or irritation. 11/14/19   Betancourt, Jarold Song, NP  sodium chloride (OCEAN) 0.65 % SOLN nasal spray Place 2 sprays into both nostrils every 2 (two) hours while awake. Patient not taking: Reported on 07/29/2020 10/25/19 11/24/19  Barbaraann Barthel, NP    Family History Family History  Problem Relation Age of Onset   Diabetes Mother    Hyperlipidemia Mother    Diabetes Father    Heart disease Father    Parkinson's disease Father    Diabetes Brother    Breast cancer Neg Hx     Social History Social History  Tobacco Use   Smoking status: Never    Passive exposure: Yes   Smokeless tobacco: Never  Substance Use Topics   Alcohol use: No   Drug use: No     Allergies   Patient has no known allergies.   Review of Systems Review of Systems Per HPI  Physical Exam Triage Vital Signs ED Triage Vitals  Enc Vitals Group     BP 10/30/20 1035 127/82     Pulse Rate 10/30/20 1035 70     Resp --      Temp 10/30/20 1035 98.1 F (36.7 C)     Temp Source 10/30/20 1035 Oral     SpO2 10/30/20 1035 98 %     Weight 10/30/20 1036 184 lb 8 oz (83.7 kg)     Height 10/30/20 1036 5\' 5"  (1.651 m)     Head Circumference --      Peak Flow --      Pain Score  10/30/20 1036 0     Pain Loc --      Pain Edu? --      Excl. in GC? --    No data found.  Updated Vital Signs BP 127/82 (BP Location: Left Arm)   Pulse 70   Temp 98.1 F (36.7 C) (Oral)   Ht 5\' 5"  (1.651 m)   Wt 184 lb 8 oz (83.7 kg)   SpO2 98%   BMI 30.70 kg/m   Visual Acuity Right Eye Distance:   Left Eye Distance:   Bilateral Distance:    Right Eye Near:   Left Eye Near:    Bilateral Near:     Physical Exam Vitals and nursing note reviewed.  Constitutional:      Appearance: Normal appearance. She is not ill-appearing.  HENT:     Head: Atraumatic.     Mouth/Throat:     Mouth: Mucous membranes are moist.  Eyes:     Extraocular Movements: Extraocular movements intact.     Conjunctiva/sclera: Conjunctivae normal.  Cardiovascular:     Rate and Rhythm: Normal rate and regular rhythm.     Heart sounds: Normal heart sounds.  Pulmonary:     Effort: Pulmonary effort is normal.     Breath sounds: Normal breath sounds. No wheezing or rales.  Musculoskeletal:        General: Normal range of motion.     Cervical back: Normal range of motion and neck supple.  Skin:    General: Skin is warm and dry.     Findings: Erythema present.     Comments: Small localized area of erythema, tenderness to palpation at right third MCP with decreased range of motion of third digit.  No skin lesions  Neurological:     Mental Status: She is alert and oriented to person, place, and time.     Comments: Right upper extremity neurovascularly intact  Psychiatric:        Mood and Affect: Mood normal.        Thought Content: Thought content normal.        Judgment: Judgment normal.     UC Treatments / Results  Labs (all labs ordered are listed, but only abnormal results are displayed) Labs Reviewed - No data to display  EKG   Radiology No results found.  Procedures Procedures (including critical care time)  Medications Ordered in UC Medications - No data to display  Initial  Impression / Assessment and Plan / UC Course  I have reviewed the triage  vital signs and the nursing notes.  Pertinent labs & imaging results that were available during my care of the patient were reviewed by me and considered in my medical decision making (see chart for details).     Possibly gout or other inflammatory arthritis, will avoid steroids given her poorly controlled diabetes.  Treat with indomethacin, colchicine, Epsom salt soaks and rest.  Follow-up with PCP if worsening or not resolving.  Final Clinical Impressions(s) / UC Diagnoses   Final diagnoses:  Localized swelling on right hand  Right hand pain   Discharge Instructions   None    ED Prescriptions     Medication Sig Dispense Auth. Provider   indomethacin (INDOCIN) 50 MG capsule Take 1 capsule (50 mg total) by mouth 2 (two) times daily with a meal. 14 capsule Particia Nearing, PA-C   colchicine 0.6 MG tablet Take 2 tabs at onset of pain, then may repeat 1 tab in 1 hour if persisting. Repeat daily until resolved 10 tablet Particia Nearing, [place]      PDMP not reviewed this encounter.   Particia Nearing, [place] 10/30/20 1517

## 2020-10-30 NOTE — ED Triage Notes (Signed)
Patient states that her right hand began swelling yesterday.  No apparent injury.  Painful and some redness, no history of gout.  Patient did take a BCR Arthritis.

## 2020-11-15 ENCOUNTER — Ambulatory Visit: Payer: No Typology Code available for payment source | Admitting: Dietician

## 2021-02-26 ENCOUNTER — Other Ambulatory Visit: Payer: Self-pay

## 2021-02-26 ENCOUNTER — Ambulatory Visit (INDEPENDENT_AMBULATORY_CARE_PROVIDER_SITE_OTHER): Payer: No Typology Code available for payment source

## 2021-02-26 ENCOUNTER — Encounter: Payer: Self-pay | Admitting: Podiatry

## 2021-02-26 ENCOUNTER — Ambulatory Visit: Payer: No Typology Code available for payment source | Admitting: Podiatry

## 2021-02-26 DIAGNOSIS — M109 Gout, unspecified: Secondary | ICD-10-CM | POA: Diagnosis not present

## 2021-02-26 DIAGNOSIS — M7751 Other enthesopathy of right foot: Secondary | ICD-10-CM | POA: Diagnosis not present

## 2021-02-26 MED ORDER — TRIAMCINOLONE ACETONIDE 10 MG/ML IJ SUSP
10.0000 mg | Freq: Once | INTRAMUSCULAR | Status: AC
  Administered 2021-02-26: 10 mg

## 2021-02-26 NOTE — Progress Notes (Signed)
Subjective:   Patient ID: Vanessa Sandoval, female   DOB: 65 y.o.   MRN: [id number]   HPI Patient presents stating that the right first metatarsal head has been inflamed and red and that it has been somewhat bothering her for a few months and then has gotten very bad over the last couple days   ROS      Objective:  Physical Exam  Neurovascular status intact with redness and swelling around the first MPJ right with fluid buildup pain with palpation and discoloration on the surface.  Patient is found to have good digital perfusion well oriented x3     Assessment:  Possibility for acute gout even though the pain of several months does not fit but the acute element of the last few days does     Plan:  H&P reviewed condition and at this point I did focus on the acute inflammation doing sterile prep and injected the first MPJ medial 3 mg dexamethasone Kenalog 5 mg Xylocaine.  I then discussed gout educating her on it and foods to be careful and I am sending out for blood work to rule out uric acid elevation or any other component elevation.  I will see back 1 week to review  X-rays indicate there is some reactivity around the first metatarsal head right but no significant bunion formation

## 2021-02-26 NOTE — Progress Notes (Signed)
art 

## 2021-02-27 ENCOUNTER — Ambulatory Visit: Payer: No Typology Code available for payment source | Admitting: Podiatry

## 2021-02-28 LAB — ANA, IFA COMPREHENSIVE PANEL
Anti Nuclear Antibody (ANA): NEGATIVE
ENA SM Ab Ser-aCnc: <1 AI
SM/RNP: <1 AI
SSA (Ro) (ENA) Antibody, IgG: <1 AI
SSB (La) (ENA) Antibody, IgG: <1 AI
Scleroderma (Scl-70) (ENA) Antibody, IgG: <1 AI
ds DNA Ab: 5 IU/mL — ABNORMAL HIGH

## 2021-02-28 LAB — SEDIMENTATION RATE: Sed Rate: 34 mm/h — ABNORMAL HIGH (ref 0–30)

## 2021-02-28 LAB — RHEUMATOID FACTOR: Rhuematoid fact SerPl-aCnc: <14 IU/mL (ref <14)

## 2021-02-28 LAB — URIC ACID: Uric Acid, Serum: 6.8 mg/dL (ref 2.5–7.0)

## 2021-02-28 LAB — C-REACTIVE PROTEIN: CRP: 13.3 mg/L — ABNORMAL HIGH (ref <8.0)

## 2021-03-05 ENCOUNTER — Ambulatory Visit: Payer: No Typology Code available for payment source | Admitting: Podiatry

## 2021-03-10 ENCOUNTER — Encounter: Payer: Self-pay | Admitting: Podiatry

## 2021-03-10 ENCOUNTER — Other Ambulatory Visit: Payer: Self-pay

## 2021-03-10 ENCOUNTER — Ambulatory Visit: Payer: No Typology Code available for payment source | Admitting: Podiatry

## 2021-03-10 DIAGNOSIS — M779 Enthesopathy, unspecified: Secondary | ICD-10-CM | POA: Diagnosis not present

## 2021-03-10 DIAGNOSIS — M109 Gout, unspecified: Secondary | ICD-10-CM

## 2021-03-10 NOTE — Progress Notes (Signed)
Subjective:   Patient ID: Vanessa Sandoval, female   DOB: 65 y.o.   MRN: [id number]   HPI Patient presents stating that she is not getting the same pain that she used to get but does have some still stiffness in the joint and redness   ROS      Objective:  Physical Exam  Neurovascular status intact with patient found to have inflammation of the right first MPJ that is improved but is still present upon deep palpation     Assessment:  Probability for low-grade gout symptomatology that has settled down with possibility for long-term reoccurrence that was discussed today     Plan:  Reviewed with her this condition and at this point I went ahead and we will just take a wait-and-see attitude with this and see how it does.  Patient will be seen back for [place] to recheck again encouraged to call questions concerns

## 2021-06-08 ENCOUNTER — Emergency Department (HOSPITAL_COMMUNITY)
Admission: EM | Admit: 2021-06-08 | Discharge: 2021-06-09 | Disposition: A | Discharge status: Home / Self Care | Payer: No Typology Code available for payment source | Attending: Emergency Medicine | Admitting: Emergency Medicine

## 2021-06-08 ENCOUNTER — Other Ambulatory Visit: Payer: Self-pay

## 2021-06-08 ENCOUNTER — Encounter (HOSPITAL_COMMUNITY): Payer: Self-pay

## 2021-06-08 ENCOUNTER — Ambulatory Visit (HOSPITAL_COMMUNITY)
Admission: EM | Admit: 2021-06-08 | Discharge: 2021-06-08 | Disposition: A | Discharge status: Home / Self Care | Payer: No Typology Code available for payment source

## 2021-06-08 DIAGNOSIS — Z79899 Other long term (current) drug therapy: Secondary | ICD-10-CM | POA: Diagnosis not present

## 2021-06-08 DIAGNOSIS — N189 Chronic kidney disease, unspecified: Secondary | ICD-10-CM | POA: Insufficient documentation

## 2021-06-08 DIAGNOSIS — Z794 Long term (current) use of insulin: Secondary | ICD-10-CM | POA: Insufficient documentation

## 2021-06-08 DIAGNOSIS — Z7984 Long term (current) use of oral hypoglycemic drugs: Secondary | ICD-10-CM | POA: Diagnosis not present

## 2021-06-08 DIAGNOSIS — N9489 Other specified conditions associated with female genital organs and menstrual cycle: Secondary | ICD-10-CM | POA: Insufficient documentation

## 2021-06-08 DIAGNOSIS — E1165 Type 2 diabetes mellitus with hyperglycemia: Secondary | ICD-10-CM | POA: Diagnosis not present

## 2021-06-08 DIAGNOSIS — R739 Hyperglycemia, unspecified: Secondary | ICD-10-CM

## 2021-06-08 DIAGNOSIS — I129 Hypertensive chronic kidney disease with stage 1 through stage 4 chronic kidney disease, or unspecified chronic kidney disease: Secondary | ICD-10-CM | POA: Diagnosis not present

## 2021-06-08 LAB — OSMOLALITY: Osmolality: 314 mOsm/kg — ABNORMAL HIGH (ref 275–295)

## 2021-06-08 LAB — I-STAT VENOUS BLOOD GAS, ED
Acid-Base Excess: 4 mmol/L — ABNORMAL HIGH (ref 0.0–2.0)
Bicarbonate: 29 mmol/L — ABNORMAL HIGH (ref 20.0–28.0)
Calcium, Ion: 1.14 mmol/L — ABNORMAL LOW (ref 1.15–1.40)
HCT: 40 % (ref 36.0–46.0)
Hemoglobin: 13.6 g/dL (ref 12.0–15.0)
O2 Saturation: 87 %
Potassium: 3.2 mmol/L — ABNORMAL LOW (ref 3.5–5.1)
Sodium: 129 mmol/L — ABNORMAL LOW (ref 135–145)
TCO2: 30 mmol/L (ref 22–32)
pCO2, Ven: 42.3 mmHg — ABNORMAL LOW (ref 44–60)
pH, Ven: 7.444 — ABNORMAL HIGH (ref 7.25–7.43)
pO2, Ven: 51 mmHg — ABNORMAL HIGH (ref 32–45)

## 2021-06-08 LAB — URINALYSIS, ROUTINE W REFLEX MICROSCOPIC
Bilirubin Urine: NEGATIVE
Glucose, UA: >=500 mg/dL — AB
Hgb urine dipstick: NEGATIVE
Ketones, ur: NEGATIVE mg/dL
Leukocytes,Ua: NEGATIVE
Nitrite: NEGATIVE
Protein, ur: NEGATIVE mg/dL
Specific Gravity, Urine: 1.024 (ref 1.005–1.030)
pH: 5 (ref 5.0–8.0)

## 2021-06-08 LAB — BASIC METABOLIC PANEL
Anion gap: 14 (ref 5–15)
BUN: 41 mg/dL — ABNORMAL HIGH (ref 8–23)
CO2: 22 mmol/L (ref 22–32)
Calcium: 9.1 mg/dL (ref 8.9–10.3)
Chloride: 90 mmol/L — ABNORMAL LOW (ref 98–111)
Creatinine, Ser: 1.76 mg/dL — ABNORMAL HIGH (ref 0.44–1.00)
GFR, Estimated: 32 mL/min — ABNORMAL LOW (ref >60)
Glucose, Bld: 709 mg/dL (ref 70–99)
Potassium: 3.2 mmol/L — ABNORMAL LOW (ref 3.5–5.1)
Sodium: 126 mmol/L — ABNORMAL LOW (ref 135–145)

## 2021-06-08 LAB — CBC
HCT: 42 % (ref 36.0–46.0)
Hemoglobin: 15.3 g/dL — ABNORMAL HIGH (ref 12.0–15.0)
MCH: 32.1 pg (ref 26.0–34.0)
MCHC: 36.4 g/dL — ABNORMAL HIGH (ref 30.0–36.0)
MCV: 88.1 fL (ref 80.0–100.0)
Platelets: 268 10*3/uL (ref 150–400)
RBC: 4.77 MIL/uL (ref 3.87–5.11)
RDW: 12.1 % (ref 11.5–15.5)
WBC: 7.7 10*3/uL (ref 4.0–10.5)
nRBC: 0 % (ref 0.0–0.2)

## 2021-06-08 LAB — CBG MONITORING, ED
Glucose-Capillary: >600 mg/dL (ref 70–99)
Glucose-Capillary: 464 mg/dL — ABNORMAL HIGH (ref 70–99)
Glucose-Capillary: 425 mg/dL — ABNORMAL HIGH (ref 70–99)
Glucose-Capillary: 382 mg/dL — ABNORMAL HIGH (ref 70–99)
Glucose-Capillary: 273 mg/dL — ABNORMAL HIGH (ref 70–99)

## 2021-06-08 LAB — BETA-HYDROXYBUTYRIC ACID: Beta-Hydroxybutyric Acid: 0.67 mmol/L — ABNORMAL HIGH (ref 0.05–0.27)

## 2021-06-08 MED ORDER — INSULIN ASPART 100 UNIT/ML IJ SOLN
10.0000 [IU] | Freq: Once | INTRAMUSCULAR | Status: AC
  Administered 2021-06-08: 10 [IU] via SUBCUTANEOUS

## 2021-06-08 MED ORDER — POTASSIUM CHLORIDE 10 MEQ/100ML IV SOLN
10.0000 meq | INTRAVENOUS | Status: AC
  Administered 2021-06-08 (×2): 10 meq via INTRAVENOUS
  Filled 2021-06-08 (×2): qty 100

## 2021-06-08 MED ORDER — SODIUM CHLORIDE 0.9 % IV BOLUS
1000.0000 mL | Freq: Once | INTRAVENOUS | Status: AC
  Administered 2021-06-08: 1000 mL via INTRAVENOUS

## 2021-06-08 MED ORDER — INSULIN ASPART 100 UNIT/ML IJ SOLN
5.0000 [IU] | Freq: Once | INTRAMUSCULAR | Status: AC
  Administered 2021-06-08: 5 [IU] via SUBCUTANEOUS

## 2021-06-08 MED ORDER — POTASSIUM CHLORIDE CRYS ER 20 MEQ PO TBCR
40.0000 meq | EXTENDED_RELEASE_TABLET | Freq: Once | ORAL | Status: AC
  Administered 2021-06-08: 40 meq via ORAL
  Filled 2021-06-08: qty 2

## 2021-06-08 MED ORDER — DEXTROSE 50 % IV SOLN: 0.0000 mL | INTRAVENOUS | Status: DC | PRN

## 2021-06-08 MED ORDER — DEXTROSE IN LACTATED RINGERS 5 % IV SOLN: INTRAVENOUS | Status: DC

## 2021-06-08 MED ORDER — LACTATED RINGERS IV SOLN: INTRAVENOUS | Status: DC

## 2021-06-08 MED ORDER — INSULIN REGULAR(HUMAN) IN NACL 100-0.9 UT/100ML-% IV SOLN
INTRAVENOUS | Status: DC
  Filled 2021-06-08: qty 100

## 2021-06-08 MED ORDER — INSULIN REGULAR(HUMAN) IN NACL 100-0.9 UT/100ML-% IV SOLN: INTRAVENOUS | Status: DC

## 2021-06-08 NOTE — ED Triage Notes (Signed)
Pt here for hyperglycemia. Pt ran out of insulin 8 days ago, called her PCP to get a new prescription, PCP told her she couldn't sent one without coming in and being seen, pt doesn't have appt until the end of the week. Pt states she had been fine but felt her sugar was high today, home cbg read HI. Pt denies abd pain, N/V, HA. ?

## 2021-06-08 NOTE — ED Provider Notes (Cosign Needed Addendum)
[facility] EMER[facility]ENCY [facility] Provider Note   CSN: [id number] Arrival date & time: 06/08/21  1610     History  Chief Complaint  Patient presents with   Hyperglycemia    Vanessa Sandoval is a 66 y.o. female with a past medical history of diabetes mellitus, hypertension, hyperlipidemia, CKD.  Presents to the emergency department with a complaint of hyperglycemia.  Patient reports that she has been out of her Lantus insulin over the last 8 days.  Patient reports that she normally takes 60 units nightly.  She last took 30 units on 05/31/2021.  Patient reports that her PCP was unable to refill her Lantus medication without an appointment.  She is scheduled to see her PCP on 3/14.  Patient reports that she has had fatigue, polyuria and polydipsia over the last 8 days.  When checking her blood sugars today patient reports that glucometer read high.  Patient came to the emergency department for further evaluation.  Patient reports that she has been trying to modify her diet over the last 8 days due to being out of her insulin.  She denies any fever, chills, shortness of breath, nausea, vomiting, diarrhea, abdominal pain.   Hyperglycemia Associated symptoms: increased thirst and polyuria   Associated symptoms: no abdominal pain, no chest pain, no confusion, no dizziness, no dysuria, no fever, no nausea, no shortness of breath and no vomiting       Home Medications Prior to Admission medications   Medication Sig Start Date End Date Taking? Authorizing Provider  BD PEN NEEDLE NANO 2ND GEN 32G X 4 MM MISC Inject 1 Product into the skin Nightly. 09/11/19   [provider]  colchicine 0.6 MG tablet Take 2 tabs at onset of pain, then may repeat 1 tab in 1 hour if persisting. Repeat daily until resolved 10/30/20   Particia NearingLane, Rachel Elizabeth, PA-C  FARXIGA 10 MG TABS tablet Take 10 mg by mouth daily. 09/26/19   [provider]  fluticasone (FLONASE) 50 MCG/ACT nasal spray  Place 1 spray into both nostrils 2 (two) times daily. Patient not taking: Reported on 07/29/2020 10/25/19 12/24/19  Albina BilletBetancourt, Tina A, NP  glucose blood (CONTOUR TEST) test strip Use as instructed 07/29/20   Betancourt, Jarold Songina A, NP  indomethacin (INDOCIN) 50 MG capsule Take 1 capsule (50 mg total) by mouth 2 (two) times daily with a meal. 10/30/20   Particia NearingLane, Rachel Elizabeth, PA-C  Lancets (FREESTYLE) lancets Use as instructed 07/29/20   Betancourt, Jarold Songina A, NP  LANTUS SOLOSTAR 100 UNIT/ML Solostar Pen Inject 40 Units into the skin daily after supper. 09/09/19   [provider]  mineral oil-hydrophilic petrolatum (AQUAPHOR) ointment Apply topically as needed for dry skin or irritation. 11/14/19   Betancourt, Jarold Songina A, NP  simvastatin (ZOCOR) 5 MG tablet Take 40 mg by mouth daily.     [provider]  sodium chloride (OCEAN) 0.65 % SOLN nasal spray Place 2 sprays into both nostrils every 2 (two) hours while awake. Patient not taking: Reported on 07/29/2020 10/25/19 11/24/19  Barbaraann BarthelBetancourt, Tina A, NP  triamterene (DYRENIUM) 50 MG capsule Take 25 mg by mouth 2 (two) times daily.    [provider]  triamterene-hydrochlorothiazide (DYAZIDE) 37.5-25 MG capsule Take 1 capsule by mouth daily.    [provider]      Allergies    Patient has no known allergies.    Review of Systems   Review of Systems  Constitutional:  Negative for chills and fever.  Eyes:  Negative for visual disturbance.  Respiratory:  Negative for shortness of breath.   Cardiovascular:  Negative for chest pain.  Gastrointestinal:  Negative for abdominal pain, diarrhea, nausea and vomiting.  Endocrine: Positive for polydipsia and polyuria. Negative for polyphagia.  Genitourinary:  Positive for frequency. Negative for difficulty urinating, dysuria, hematuria and urgency.  Musculoskeletal:  Negative for back pain and neck pain.  Skin:  Negative for color change and rash.  Neurological:  Negative for dizziness,  syncope, light-headedness and headaches.  Psychiatric/Behavioral:  Negative for confusion.    Physical Exam Updated Vital Signs BP (!) 161/89    Pulse (!) 105    Temp 98.3 F (36.8 C)    Resp 18    SpO2 95%  Physical Exam Vitals and nursing note reviewed.  Constitutional:      General: She is not in acute distress.    Appearance: She is not ill-appearing, toxic-appearing or diaphoretic.  HENT:     Head: Normocephalic.  Eyes:     General: No scleral icterus.       Right eye: No discharge.        Left eye: No discharge.  Cardiovascular:     Rate and Rhythm: Normal rate.  Pulmonary:     Effort: Pulmonary effort is normal. No tachypnea, bradypnea or respiratory distress.     Breath sounds: Normal breath sounds.  Abdominal:     General: There is no distension. There are no signs of injury.     Palpations: Abdomen is soft. There is no mass or pulsatile mass.     Tenderness: There is no abdominal tenderness. There is no guarding or rebound.  Skin:    General: Skin is warm and dry.  Neurological:     General: No focal deficit present.     Mental Status: She is alert.  Psychiatric:        Behavior: Behavior is cooperative.    ED Results / Procedures / Treatments   Labs (all labs ordered are listed, but only abnormal results are displayed) Labs Reviewed  BASIC METABOLIC PANEL - Abnormal; Notable for the following components:      Result Value   Sodium 126 (*)    Potassium 3.2 (*)    Chloride 90 (*)    Glucose, Bld 709 (*)    BUN 41 (*)    Creatinine, Ser 1.76 (*)    GFR, Estimated 32 (*)    All other components within normal limits  CBC - Abnormal; Notable for the following components:   Hemoglobin 15.3 (*)    MCHC 36.4 (*)    All other components within normal limits  URINALYSIS, ROUTINE W REFLEX MICROSCOPIC - Abnormal; Notable for the following components:   Color, Urine STRAW (*)    Glucose, UA >=500 (*)    Bacteria, UA RARE (*)    All other components within normal  limits  OSMOLALITY - Abnormal; Notable for the following components:   Osmolality 314 (*)    All other components within normal limits  BETA-HYDROXYBUTYRIC ACID - Abnormal; Notable for the following components:   Beta-Hydroxybutyric Acid 0.67 (*)    All other components within normal limits  CBG MONITORING, ED - Abnormal; Notable for the following components:   Glucose-Capillary >600 (*)    All other components within normal limits  CBG MONITORING, ED - Abnormal; Notable for the following components:   Glucose-Capillary 464 (*)    All other components within normal limits  I-STAT VENOUS BLOOD GAS,  ED - Abnormal; Notable for the following components:   pH, Ven 7.444 (*)    pCO2, Ven 42.3 (*)    pO2, Ven 51 (*)    Bicarbonate 29.0 (*)    Acid-Base Excess 4.0 (*)    Sodium 129 (*)    Potassium 3.2 (*)    Calcium, Ion 1.14 (*)    All other components within normal limits  CBG MONITORING, ED - Abnormal; Notable for the following components:   Glucose-Capillary 425 (*)    All other components within normal limits  CBG MONITORING, ED - Abnormal; Notable for the following components:   Glucose-Capillary 382 (*)    All other components within normal limits  CBG MONITORING, ED - Abnormal; Notable for the following components:   Glucose-Capillary 273 (*)    All other components within normal limits  CBG MONITORING, ED - Abnormal; Notable for the following components:   Glucose-Capillary 215 (*)    All other components within normal limits  BLOOD GAS, VENOUS    EKG None  Radiology No results found.  Procedures .Critical Care Performed by: Haskel Schroeder, PA-C Authorized by: Haskel Schroeder, PA-C   Critical care provider statement:    Critical care time (minutes):  30   Critical care was necessary to treat or prevent imminent or life-threatening deterioration of the following conditions:  Metabolic crisis   Critical care was time spent personally by me on the  following activities:  Development of treatment plan with patient or surrogate, evaluation of patient's response to treatment, examination of patient, ordering and review of laboratory studies, ordering and review of radiographic studies, ordering and performing treatments and interventions, pulse oximetry, re-evaluation of patient's condition, review of old charts and obtaining history from patient or surrogate Comments:     Hyperglycemia requiring frequent cbg monitoring, as well as insulin and fluid administration     Medications Ordered in ED Medications  potassium chloride 10 mEq in 100 mL IVPB (has no administration in time range)  potassium chloride SA (KLOR-CON M) CR tablet 40 mEq (has no administration in time range)  sodium chloride 0.9 % bolus 1,000 mL (1,000 mLs Intravenous New Bag/Given 06/08/21 1707)    ED Course/ Medical Decision Making/ A&P                           Medical Decision Making Amount and/or Complexity of Data Reviewed Labs: ordered.  Risk Prescription drug management.   Alert 66 year old female in no acute distress, nontoxic-appearing.  Presents to the emergency department with a chief complaint of hyperglycemia.  Information obtained from patient.  Past medical records including previous provider notes and labs were reviewed.  Patient has medical history as outlined in HPI which complicates her care.  Patient is type II diabetic, has been out of insulin for 8 days.  Presents with hyperglycemia.  CBG greater than 600.  Patient has no history of CHF.  Will initiate 1 L fluid bolus at this time.  We will hold any insulin until lab results are back to assess for possible DKA.  Labs were independently viewed and interpreted by myself.  Pertinent findings include: -CBG greater than 600 -Serum glucose 709 -Anion gap within normal limits -Bicarb within normal limits -Venous pH 7.4 -Sodium 126 however when corrected for hyperglycemia is 136 -Potassium  3.2 -Creatinine 1.76, patient has history of CKD baseline appears to vary from 1.2-1.5 -UA shows glucose greater than 500 however no ketones  present. -Beta hydroxybutyric acid 0.67 Osmolality 314  Patient does not meet criteria for DKA or HHS..  Patient will need insulin provided for her hyperglycemia.  We will hold this until potassium repletion is completed.  After potassium repletion patient's CBG was rechecked and found to be 464.  Patient given 10 units insulin.  Patient has improvement in CBG to 382.  Will give patient additional 5 units of insulin.  CBG 215 1.5 hours after second round of insulin.  Patient is in no acute distress.  Hemodynamically stable.  Admission for hypoglycemia was considered however patient has had improvement after receiving insulin.  Will prescribe patient with home dose of insulin.  Patient to follow-up with her PCP on Tuesday for further management of her diabetes.  Discussed results, findings, treatment and follow up. Patient advised of return precautions. Patient verbalized understanding and agreed with plan.  Patient care was discussed with attending patient Dr. Rosalia Hammers.        Final Clinical Impression(s) / ED Diagnoses Final diagnoses:  Hyperglycemia    Rx / DC Orders ED Discharge Orders          Ordered    LANTUS SOLOSTAR 100 UNIT/ML Solostar Pen  (Dosepack) After supper        06/09/21 0001              Haskel Schroeder, PA-C 06/09/21 0142    Haskel Schroeder, PA-C 06/09/21 1108

## 2021-06-09 LAB — CBG MONITORING, ED: Glucose-Capillary: 215 mg/dL — ABNORMAL HIGH (ref 70–99)

## 2021-06-09 MED ORDER — LANTUS SOLOSTAR 100 UNIT/ML ~~LOC~~ SOPN: 60.0000 [IU] | PEN_INJECTOR | SUBCUTANEOUS | 0 refills | Status: AC

## 2021-06-09 NOTE — Discharge Instructions (Addendum)
You came to the emergency department today to be evaluated for your high blood sugar.  Your physical exam and lab results were reassuring.  You were given fluids and insulin to help reduce your blood sugar.  Please watch your blood sugar closely over the next few days.  I have given you prescription for your insulin to take until you can have your prescription refilled by your primary care provider.  Please follow-up closely with your primary care provider for further management of your diabetes. ? ?Get help right away if: ?Your blood glucose monitor reads "high" even when you are taking insulin. ?You have trouble breathing. ?You have a change in how you think, feel, or act (mental status). ?You have nausea or vomiting that does not go away. ?

## 2021-07-14 ENCOUNTER — Telehealth: Payer: Self-pay | Admitting: *Deleted

## 2021-07-14 DIAGNOSIS — M25569 Pain in unspecified knee: Secondary | ICD-10-CM

## 2021-07-14 DIAGNOSIS — E1122 Type 2 diabetes mellitus with diabetic chronic kidney disease: Secondary | ICD-10-CM

## 2021-07-14 NOTE — Telephone Encounter (Signed)
Pt in to clinic c/o L knee pain. Aching and tight pain since yesterday. Denies known injury. Denies swelling, erythema, warmth to area. No pain in calf or back of knee. Wondering if arthritis could be cause or something else since no known injury. Sts she went to stand up from desk today and knee felt like it was going to give way and caused severe pain that made her feel dizzy, diaphoretic and pale. She had to sit down and sx passed after about 2 minutes. No other episodes of pain that severe. She has mobic at home from previous hand arthritis and wants to know if she can take that. Advised she can as long as no other NSAIDs. She is agreeable and sts she avoids anyway. Offered biofreeze, tylenol. Pt declines at this time. NP appt made for tomorrow.  ? ?Pt also asking about Be Well labs. ER encounter last month due to hyperglycemia but needed labs not drawn then. She started Ozempic one month ago. Last full panel April 2022. Should draw be held additional time to allow for Ozempic results to be included? Will discuss with NP Inetta Fermo in clinic tomorrow.  ?

## 2021-07-15 ENCOUNTER — Encounter: Payer: Self-pay | Admitting: Registered Nurse

## 2021-07-15 ENCOUNTER — Ambulatory Visit: Payer: Self-pay | Admitting: Registered Nurse

## 2021-07-15 VITALS — BP 126/85 | HR 76 | Temp 96.9°F

## 2021-07-15 DIAGNOSIS — E1169 Type 2 diabetes mellitus with other specified complication: Secondary | ICD-10-CM | POA: Insufficient documentation

## 2021-07-15 DIAGNOSIS — M25562 Pain in left knee: Secondary | ICD-10-CM

## 2021-07-15 DIAGNOSIS — L853 Xerosis cutis: Secondary | ICD-10-CM

## 2021-07-15 DIAGNOSIS — E559 Vitamin D deficiency, unspecified: Secondary | ICD-10-CM | POA: Insufficient documentation

## 2021-07-15 DIAGNOSIS — E1122 Type 2 diabetes mellitus with diabetic chronic kidney disease: Secondary | ICD-10-CM

## 2021-07-15 NOTE — Patient Instructions (Addendum)
Diabetes Mellitus and Skin Care ?Diabetes, also called diabetes mellitus, can lead to skin problems. People with diabetes have a higher risk for many types of skin complications because poorly controlled blood sugar (glucose) levels can cause problems over time. These problems include: ?Damage to nerves. This can affect your ability to feel wounds, which means you may not notice minor skin injuries that could lead to serious problems. This can also decrease the amount that you sweat, causing dry skin that can break down. ?Damage to blood vessels. The lack of blood flow can cause skin to break down and can slow healing time, which can lead to infections. ?Areas of skin that become thick or discolored. ?Common skin conditions ?There are certain skin conditions that commonly affect people who have diabetes, such as: ?Dry skin. ?Thin skin. The skin on the feet may get thinner, break more easily, and heal more slowly than normal. ?Bacterial skin infections, including: ?Styes. ?Boils. ?Infected hair follicles. ?Infections of the skin around the nails. ?Fungal skin infections. These are most common in areas where skin rubs together, such as in the armpits or under the breasts. ?Common skin changes ?Diabetes can also cause the skin to change. You may develop: ?Dark, velvety markings on the skin that usually appear on the face, neck, armpits, inner thighs, and groin. ?Red, raised, scar-like tissue that may itch, feel painful, or develop into a wound. ?Blisters on the feet, toes, hands, or fingers. ?Thickened, wax-like areas of skin that usually occur on the hands, forehead, or toes. ?Brown or red, ring-shaped or half-ring-shaped patches of skin on the ears or fingers. ?Pea-shaped, yellow bumps that may be itchy and surrounded by a red ring. This usually affects the arms, feet, buttocks, and the top of the hands. ?Round, discolored patches of tan skin that do not hurt or itch. These may look like age spots. ?General  tips ?Most skin problems can be prevented or treated easily if caught early. Talk with your health care provider if you have any concerns. General tips usually include: ?Check your skin every day for cuts, bruises, redness, blisters, or sores, especially on your feet. Tell your health care provider about any cuts, wounds, or sores that you have and if they are healing slowly. ?Keep your skin clean and dry. Do not use hot water. ?Moisturize your skin to prevent chapping. ?Do not scratch dry skin. Scratching dry skin can expose it to infection. ?Keep your blood glucose levels within target range. ?Supplies needed: ?Mild soap or gentle skin cleanser. ?Lotion. ?How to care for dry itchy skin ?It is common for people with diabetes to have itchy skin caused by dryness. Frequent high glucose levels can cause itchiness, and poor circulation and certain skin infections can make dry, itchy skin worse. If you have itchy skin that is red or covered in a rash, this could be a sign of an allergic reaction. ?If you have a rash or if your skin is very itchy, contact your health care provider. You may need help to manage your diabetes better, or you may need treatment for an infection. Untreated fungal infections can be dangerous because they allow more serious bacterial infections to enter the body. ?To relieve dry skin and itching: ?Avoid very hot showers and baths. ?Use mild soap and gentle skin cleansers. Do not use soap that is perfumed or harsh or that dries your skin. Moisturizing soaps may help. ?Put on moisturizing lotion as soon as you finish bathing. ?Follow these instructions at home: ? ?  Schedule a foot exam with your health care provider once every year. This exam includes an inspection of the structure and skin of your feet. ?Make sure that your health care provider performs a visual foot exam at every medical visit. ?If you get a skin injury, such as a cut, blister, or sore, check the area every day for signs of  infection. Check for: ?Redness, swelling, or pain. ?Fluid or blood. ?Warmth. ?Pus or a bad smell. ?Do not use any products that contain nicotine or tobacco. These products include cigarettes, chewing tobacco, and vaping devices, such as e-cigarettes. If you need help quitting, ask your health care provider. ?Take over-the-counter and prescription medicines only as told by your health care provider. This includes all diabetes medicines you are taking. ?Where to find more information ?American Diabetes Association: www.diabetes.org ?Association of Diabetes Care & Education Specialists: www.diabeteseducator.org ?Contact a health care provider if you: ?Develop a cut or sore, especially on your feet. ?Develop signs of infection after a skin injury. ?Have itchy skin that develops redness or a rash. ?Have discolored areas of skin. ?Have areas where your skin is changing, such as thickening or appearing shiny. ?Summary ?People with diabetes have a higher risk for many types of skin complications. This is because poorly controlled blood sugar (glucose) levels can cause skin problems over time. ?Most skin problems can be prevented or treated easily if caught early. Keep your blood glucose levels within target range. ?Check your skin every day for cuts, bruises, redness, blisters, or sores, especially on your feet. ?Tell your health care provider about any cuts, wounds, or sores that you have and if they are healing slowly. ?This information is not intended to replace advice given to you by your health care provider. Make sure you discuss any questions you have with your health care provider. ?Document Revised: 10/05/2019 Document Reviewed: 10/05/2019 ?Elsevier Patient Education ? 2023 [employer] ?Knee Sprain, Adult ? ?A knee sprain is a stretch or tear in a knee ligament. Knee ligaments are tissues that connect bones in the knee to each other. ?What are the causes? ?This condition often results from: ?A fall. ?An injury to  the knee. ?What are the signs or symptoms? ?Symptoms of this condition include: ?Trouble straightening or bending the leg. ?Swelling in the knee. ?Bruising around the knee. ?Tenderness or pain in the knee. ?Muscle spasms around the knee. ?How is this diagnosed? ?This condition may be diagnosed based on: ?A physical exam. ?A history of what happened just before you started to have symptoms. ?Tests, including: ?An X-ray. This may be done to make sure no bones are broken. ?An MRI. This may be done to check if the ligament is torn. ?Stress testing of the knee. This may be done to check ligament damage. ?How is this treated? ?Treatment for this condition may involve: ?Keeping the knee still (immobilized) with a cast, brace, or splint. ?Applying ice to the knee. This helps with pain and swelling. ?Raising (elevating) the knee above the level of your heart when you are resting. This helps with pain and swelling. ?Taking medicine for pain. ?Doing exercises to prevent or limit permanent weakness or stiffness in your knee. ?Having surgery to reconnect the ligament to the bone or to reconstruct it. This may be needed if the ligament is completely torn. ?Follow these instructions at home: ?If you have a splint or brace: ?Wear it as told by your health care provider. Remove it only as told by your health care provider. ?Check  the skin around it every day. Tell your health care provider about any concerns. ?Loosen it if your toes tingle, become numb, or turn cold and blue. ?Keep it clean and dry. ?If you have a cast: ?Do not stick anything inside it to scratch your skin. Doing that increases your risk of infection. ?Check the skin around it every day. Tell your health care provider about any concerns. ?You may put lotion on dry skin around the edges of the cast. Do not put lotion on the skin underneath the cast. ?Keep it clean and dry. ?Bathing ?If you have a splint, brace, or cast that is not waterproof: ?Do not let it get  wet. ?Cover it with a watertight covering when you take a bath or a shower. ?Managing pain, stiffness, and swelling ? ?If directed, put ice on the injured area. To do this: ?If you have a removable splint or brace, rem

## 2021-07-15 NOTE — Progress Notes (Signed)
? ?Subjective:  ? ? Patient ID: Vanessa Sandoval, female    DOB: [birth year], 66 y.o.   MRN: [id number] ? ?65y/o estbalished female caucasian pt c/o L knee pain. See RN tcon 4/17. Anterior pain. Improved from yesterday as started mobic. Last dose this morning.  Patient reported pain so bad felt like going to pass out/cry yesterday.  Denied known injury/trauma.   ? ? ? ? ?Review of Systems  ?Constitutional:  Negative for activity change, appetite change, chills, diaphoresis, fatigue and fever.  ?HENT:  Negative for trouble swallowing and voice change.   ?Eyes:  Negative for photophobia and visual disturbance.  ?Respiratory:  Negative for cough, shortness of breath, wheezing and stridor.   ?Cardiovascular:  Negative for leg swelling.  ?Gastrointestinal:  Negative for abdominal pain, diarrhea, nausea and vomiting.  ?Endocrine: Negative for cold intolerance.  ?Genitourinary:  Negative for difficulty urinating.  ?Musculoskeletal:  Positive for arthralgias and gait problem. Negative for back pain, joint swelling, neck pain and neck stiffness.  ?Skin:  Negative for color change, pallor, rash and wound.  ?Allergic/Immunologic: Negative for food allergies.  ?Neurological:  Negative for dizziness, tremors, seizures, syncope, facial asymmetry, speech difficulty, weakness, light-headedness, numbness and headaches.  ?Hematological:  Negative for adenopathy. Does not bruise/bleed easily.  ?Psychiatric/Behavioral:  Negative for agitation, confusion and sleep disturbance.   ? ?   ?Objective:  ? Physical Exam ?Vitals and nursing note reviewed.  ?Constitutional:   ?   General: She is awake. She is not in acute distress. ?   Appearance: Normal appearance. She is well-developed, well-groomed and overweight. She is not ill-appearing, toxic-appearing or diaphoretic.  ?HENT:  ?   Head: Normocephalic and atraumatic.  ?   Jaw: There is normal jaw occlusion.  ?   Salivary Glands: Right salivary gland is not diffusely enlarged. Left salivary gland  is not diffusely enlarged.  ?   Right Ear: Hearing and external ear normal.  ?   Left Ear: Hearing and external ear normal.  ?   Nose: Nose normal. No congestion or rhinorrhea.  ?   Mouth/Throat:  ?   Lips: Pink. No lesions.  ?   Mouth: Mucous membranes are moist.  ?   Pharynx: Oropharynx is clear.  ?Eyes:  ?   General: Lids are normal. Vision grossly intact. Gaze aligned appropriately. Allergic shiner present. No scleral icterus.    ?   Right eye: No discharge.     ?   Left eye: No discharge.  ?   Extraocular Movements: Extraocular movements intact.  ?   Conjunctiva/sclera: Conjunctivae normal.  ?   Pupils: Pupils are equal, round, and reactive to light.  ?Neck:  ?   Trachea: Trachea and phonation normal. No tracheal deviation.  ?Cardiovascular:  ?   Rate and Rhythm: Normal rate and regular rhythm.  ?   Pulses: Normal pulses.     ?     Radial pulses are 2+ on the right side and 2+ on the left side.  ?Pulmonary:  ?   Effort: Pulmonary effort is normal.  ?   Breath sounds: Normal breath sounds and air entry. No stridor or transmitted upper airway sounds. No wheezing.  ?   Comments: Spoke full sentences without difficulty; no cough observed in exam room ?Abdominal:  ?   General: Abdomen is flat.  ?Musculoskeletal:     ?   General: Tenderness present. No swelling or deformity. Normal range of motion.  ?   Cervical back: Normal range of motion  and neck supple. No edema, erythema, signs of trauma, rigidity, torticollis or crepitus. No pain with movement. Normal range of motion.  ?   Right knee: No swelling, deformity, effusion, erythema, ecchymosis, lacerations, bony tenderness or crepitus. Normal range of motion. No tenderness. No medial joint line, lateral joint line, MCL, LCL, ACL, PCL or patellar tendon tenderness. No LCL laxity, MCL laxity, ACL laxity or PCL laxity. Normal alignment, normal meniscus and normal patellar mobility. Normal pulse.  ?   Left knee: Crepitus present. No swelling, deformity, effusion,  erythema, ecchymosis, lacerations or bony tenderness. Normal range of motion. Tenderness present over the lateral joint line, LCL and patellar tendon. No MCL, ACL or PCL tenderness. No LCL laxity, MCL laxity, ACL laxity or PCL laxity.Normal alignment. Normal pulse.  ?   Right lower leg: No swelling, deformity, lacerations or tenderness. No edema.  ?   Left lower leg: No swelling, deformity, lacerations or tenderness. No edema.  ?   Right ankle: No swelling, deformity, ecchymosis or lacerations. No tenderness. Normal range of motion. Anterior drawer test negative.  ?   Left ankle: No swelling, deformity, ecchymosis or lacerations. No tenderness. Normal range of motion. Anterior drawer test negative.  ?     Legs: ? ?   Comments: Slower flexion to extension and reverse left compared to right but AROM equal; mildly TTP lateral joint line and patellar tendon; intermittent crepitus with AROM left only; bilateral knee circumference equal bilaterally; no ecchymosis/erythema/increased temperature; gait slow and steady in clinic; negative valgus/varus stress tests/lachmann's/anterior/posterior drawer tests bilaterally  ?Lymphadenopathy:  ?   Head:  ?   Right side of head: No submandibular or preauricular adenopathy.  ?   Left side of head: No submandibular or preauricular adenopathy.  ?   Cervical:  ?   Right cervical: No superficial cervical adenopathy. ?   Left cervical: No superficial cervical adenopathy.  ?Skin: ?   General: Skin is warm and dry.  ?   Capillary Refill: Capillary refill takes less than 2 seconds.  ?   Coloration: Skin is not ashen, cyanotic, jaundiced, mottled, pale or sallow.  ?   Findings: No abrasion, abscess, acne, bruising, burn, ecchymosis, erythema, signs of injury, laceration, lesion, petechiae, rash or wound.  ?   Nails: There is no clubbing.  ?Neurological:  ?   General: No focal deficit present.  ?   Mental Status: She is alert and oriented to person, place, and time. Mental status is at  baseline.  ?   GCS: GCS eye subscore is 4. GCS verbal subscore is 5. GCS motor subscore is 6.  ?   Cranial Nerves: Cranial nerves 2-12 are intact. No cranial nerve deficit, dysarthria or facial asymmetry.  ?   Sensory: Sensation is intact.  ?   Motor: Motor function is intact. No weakness, tremor, atrophy, abnormal muscle tone or seizure activity.  ?   Coordination: Coordination is intact. Coordination normal.  ?   Gait: Gait is intact. Gait normal.  ?   Comments: In/out of chair and on/off exam table without difficulty; gait sure and steady in clinic; bilateral hand grasp equal 5/5  ?Psychiatric:     ?   Attention and Perception: Attention and perception normal.     ?   Mood and Affect: Mood and affect normal.     ?   Speech: Speech normal.     ?   Behavior: Behavior normal. Behavior is cooperative.     ?   Thought Content: Thought  content normal.     ?   Cognition and Memory: Cognition and memory normal.     ?   Judgment: Judgment normal.  ? ? ? ? ? Latest Reference Range & Units Most Recent  ?Glucose-Capillary 70 - 99 mg/dL 215 (H) ?06/08/21 23:58  ?Sample type  VENOUS ?06/08/21 17:07  ?pH, Ven 7.25 - 7.43  7.444 (H) ?06/08/21 17:07  ?pCO2, Ven 44 - 60 mmHg 42.3 (L) ?06/08/21 17:07  ?pO2, Ven 32 - 45 mmHg 51 (H) ?06/08/21 17:07  ?TCO2 22 - 32 mmol/L 30 ?06/08/21 17:07  ?Acid-Base Excess 0.0 - 2.0 mmol/L 4.0 (H) ?06/08/21 17:07  ?Bicarbonate 20.0 - 28.0 mmol/L 29.0 (H) ?06/08/21 17:07  ?O2 Saturation % 87 ?06/08/21 17:07  ?BASIC METABOLIC PANEL  Rpt !! ?0/34/03 16:23  ?Sodium 135 - 145 mmol/L 129 (L) ?06/08/21 17:07  ?Potassium 3.5 - 5.1 mmol/L 3.2 (L) ?06/08/21 17:07  ?Chloride 98 - 111 mmol/L 90 (L) ?06/08/21 16:23  ?CO2 22 - 32 mmol/L 22 ?06/08/21 16:23  ?Glucose 70 - 99 mg/dL 709 (HH) ?06/08/21 16:23  ?BUN 8 - 23 mg/dL 41 (H) ?06/08/21 16:23  ?Creatinine 0.44 - 1.00 mg/dL 1.76 (H) ?06/08/21 16:23  ?Calcium 8.9 - 10.3 mg/dL 9.1 ?06/08/21 16:23  ?Anion gap 5 - 15  14 ?06/08/21 16:23  ?BUN/Creatinine Ratio 12 - 28  29 (H) ?07/26/20  09:42  ?eGFR >59 mL/min/1.73 43 (L) ?07/26/20 09:42  ?Calcium Ionized 1.15 - 1.40 mmol/L 1.14 (L) ?06/08/21 17:07  ?Phosphorus 3.0 - 4.3 mg/dL 4.2 ?07/26/20 09:42  ?Alkaline Phosphatase 44 - 121 IU/L 103 ?07/26/20 0

## 2021-07-15 NOTE — Telephone Encounter (Addendum)
Agreed with plan of care/reviewed RN Rolly Salter note.  Patient seen in clinic today see office note.  Discussed with RN Rolly Salter and patient per Up to Date ozempic greatest effects on Hgba1c first 16 weeks.  Patient started in March--will draw Hgba1c in July with Be Well Fasting labs.  Vitamin D 17 2017 paper chart [facility] results [facility].  Will repeat vitamin D level also.  Last Be Well labs 2022.  Patient and RN Rolly Salter verbalized understanding information/instructions, agreed with plan of care and had no further questions at this time. ?

## 2021-07-18 ENCOUNTER — Encounter: Payer: Self-pay | Admitting: *Deleted

## 2021-07-18 ENCOUNTER — Telehealth: Payer: Self-pay | Admitting: *Deleted

## 2021-07-18 DIAGNOSIS — M25562 Pain in left knee: Secondary | ICD-10-CM

## 2021-07-18 NOTE — Telephone Encounter (Signed)
Follow up with pt for knee pain. She reports she is feeling much better today. She has been wearing her knee brace and has minimal pain. She is continuing Mobic daily as well. Denies any further needs or concerns at this time.  ?

## 2021-07-18 NOTE — Telephone Encounter (Signed)
Reviewed RN Rolly Salter note and encounter closed. ?

## 2021-08-12 ENCOUNTER — Telehealth: Payer: Self-pay | Admitting: Registered Nurse

## 2021-08-12 ENCOUNTER — Encounter: Payer: Self-pay | Admitting: Registered Nurse

## 2021-08-12 DIAGNOSIS — E1169 Type 2 diabetes mellitus with other specified complication: Secondary | ICD-10-CM

## 2021-08-12 DIAGNOSIS — I10 Essential (primary) hypertension: Secondary | ICD-10-CM

## 2021-08-12 MED ORDER — SIMVASTATIN 20 MG PO TABS: 20.0000 mg | ORAL_TABLET | Freq: Every day | ORAL | 0 refills | Status: DC

## 2021-08-12 MED ORDER — TRIAMTERENE-HCTZ 37.5-25 MG PO CAPS: 1.0000 | ORAL_CAPSULE | Freq: Every day | ORAL | 0 refills | Status: DC

## 2021-08-12 NOTE — Telephone Encounter (Signed)
Patient requested refills zocor 20mg  po daily and triamterene hydrochlorothiazide 37.5/25mg  po daily  #90 RF0 dispensed to patient today bridge refill and RN [facility] sent request for new Rx to [facility] office 08/07/21.  Rx expired with last fill on 04/17/2021.     Latest Reference Range & Units 06/08/21 [id number]  BASIC METABOLIC PANEL  Rpt !!  Sodium 135 - 145 mmol/L 126 (L)  Potassium 3.5 - 5.1 mmol/L 3.2 (L)  Chloride 98 - 111 mmol/L 90 (L)  CO2 22 - 32 mmol/L 22  Glucose 70 - 99 mg/dL 709 (HH)  BUN 8 - 23 mg/dL 41 (H)  Creatinine 0.44 - 1.00 mg/dL 1.76 (H)  Calcium 8.9 - 10.3 mg/dL 9.1  Anion gap 5 - 15  14  !!: Data is critical [facility]): Data is critically high (L): Data is abnormally low (H): Data is abnormally high Rpt: View report in Results Review for more information  Last BP 126/85 07/15/21  PCM does not use Epic/[facility] for charting.

## 2021-08-21 ENCOUNTER — Other Ambulatory Visit: Payer: Self-pay | Admitting: *Deleted

## 2021-08-21 MED ORDER — BD PEN NEEDLE NANO 2ND GEN 32G X 4 MM MISC: 1.0000 | Freq: Every evening | 2 refills | Status: AC

## 2021-10-06 ENCOUNTER — Other Ambulatory Visit: Payer: Self-pay | Admitting: Registered Nurse

## 2021-10-07 ENCOUNTER — Telehealth: Payer: Self-pay | Admitting: Registered Nurse

## 2021-10-07 DIAGNOSIS — E1122 Type 2 diabetes mellitus with diabetic chronic kidney disease: Secondary | ICD-10-CM

## 2021-10-07 DIAGNOSIS — E559 Vitamin D deficiency, unspecified: Secondary | ICD-10-CM

## 2021-10-07 LAB — CMP12+LP+TP+TSH+6AC+CBC/D/PLT
ALT: 15 IU/L
AST: 17 IU/L (ref 0–40)
Albumin/Globulin Ratio: 1.1 — ABNORMAL LOW (ref 1.2–2.2)
Albumin: 3.8 g/dL (ref 3.8–4.8)
Alkaline Phosphatase: 83 IU/L (ref 44–121)
BUN/Creatinine Ratio: 28
BUN: 40 mg/dL
Basophils Absolute: 0 10*3/uL
Basos: 1 %
Bilirubin Total: 0.2 mg/dL (ref 0.0–1.2)
Calcium: 9.9 mg/dL
Chloride: 104 mmol/L (ref 96–106)
Chol/HDL Ratio: 4.3 ratio
Cholesterol, Total: 173 mg/dL
Creatinine, Ser: 1.41 mg/dL
EOS (ABSOLUTE): 0.1 10*3/uL
Eos: 2 %
Free Thyroxine Index: 2.2 (ref 1.2–4.9)
GGT: 14 IU/L
Globulin, Total: 3.6 g/dL (ref 1.5–4.5)
Glucose: 174 mg/dL — ABNORMAL HIGH (ref 70–99)
HDL: 40 mg/dL (ref >39)
Hematocrit: 43.8 %
Hemoglobin: 14.9 g/dL
Immature Grans (Abs): 0 10*3/uL
Immature Granulocytes: 0 %
Iron: 84 ug/dL
LDH: 212 IU/L
LDL Chol Calc (NIH): 99 mg/dL
Lymphocytes Absolute: 2.2 10*3/uL
Lymphs: 47 %
MCH: 31.8 pg
MCHC: 34 g/dL
MCV: 94 fL
Monocytes Absolute: 0.4 10*3/uL
Monocytes: 8 %
Neutrophils Absolute: 2 10*3/uL
Neutrophils: 42 %
Phosphorus: 3.6 mg/dL
Platelets: 297 10*3/uL (ref 150–450)
Potassium: 4.3 mmol/L (ref 3.5–5.2)
RBC: 4.68 x10E6/uL
RDW: 12.9 %
Sodium: 142 mmol/L (ref 134–144)
T3 Uptake Ratio: 26 %
T4, Total: 8.4 ug/dL (ref 4.5–12.0)
TSH: 1.54 u[IU]/mL (ref 0.450–4.500)
Total Protein: 7.4 g/dL (ref 6.0–8.5)
Triglycerides: 198 mg/dL
Uric Acid: 9.5 mg/dL
VLDL Cholesterol Cal: 34 mg/dL (ref 5–40)
WBC: 4.7 10*3/uL (ref 3.4–10.8)
eGFR: 41 mL/min/{1.73_m2} — ABNORMAL LOW (ref >59)

## 2021-10-07 LAB — HGB A1C W/O EAG: Hgb A1c MFr Bld: 8.2 % — ABNORMAL HIGH (ref 4.8–5.6)

## 2021-10-07 LAB — VITAMIN D 25 HYDROXY (VIT D DEFICIENCY, FRACTURES): Vit D, 25-Hydroxy: 25.7 ng/mL — ABNORMAL LOW (ref 30.0–100.0)

## 2021-10-07 MED ORDER — CHOLECALCIFEROL 1.25 MG (50000 UT) PO TABS: 1.0000 | ORAL_TABLET | ORAL | 0 refills | Status: AC

## 2021-10-07 NOTE — Telephone Encounter (Unsigned)
My chart message sent to patient with be well results from [facility] portal and PCM/lab instruction follow up.  Cholecalciferol 50,000 units po weekly x 12 weeks #12 RF0 sent to her pharmacy of choice.  Repeat Hgba1c, vitamin D in 3 months.  eGFR continues low exitcare handouts on chronic kidney disease and diet sent to patient along with living with diabetes, high triglyceride diet, [employer] dietitian information and vitamin d deficiency.

## 2021-10-15 ENCOUNTER — Encounter: Payer: Self-pay | Admitting: Registered Nurse

## 2021-10-15 ENCOUNTER — Telehealth: Payer: Self-pay | Admitting: Registered Nurse

## 2021-10-15 DIAGNOSIS — E1122 Type 2 diabetes mellitus with diabetic chronic kidney disease: Secondary | ICD-10-CM

## 2021-10-15 DIAGNOSIS — Z6830 Body mass index (BMI) 30.0-30.9, adult: Secondary | ICD-10-CM

## 2021-10-15 NOTE — Telephone Encounter (Signed)
Patient reported to clinic staff pharmacy contacted her and she had received wrong dosing of ozempic pen (higher dose).  She had already administered her dose after picking up medication from pharmacy.  Patient reported she received 1mg  instead of .25mg .  She stated she had nausea and very decreased appetite last week and improving but still continues this week.  She is supposed to administer her regular dose .25mg  again tomorrow and wondering if she should hold her dose this week.  Reviewed epocrates ozempic pharmacology information and half life one week.  Discussed with patient her circulating blood level of medication is still probably higher than her usual dose and to hold this weeks dosing and restart again next week.  Patient to follow up with her PCM/ordering provider if new or worsening symptoms.  Patient stated tolerating po intake this week and able to work without difficulty.  Patient verbalized understanding information/instructions, agreed with plan of care and had no further questions at this time.

## 2021-11-27 ENCOUNTER — Encounter: Payer: Self-pay | Admitting: Registered Nurse

## 2021-11-27 ENCOUNTER — Telehealth: Payer: Self-pay | Admitting: Registered Nurse

## 2021-11-27 DIAGNOSIS — E1169 Type 2 diabetes mellitus with other specified complication: Secondary | ICD-10-CM

## 2021-11-27 DIAGNOSIS — I10 Essential (primary) hypertension: Secondary | ICD-10-CM

## 2021-11-27 MED ORDER — SIMVASTATIN 20 MG PO TABS: 20.0000 mg | ORAL_TABLET | Freq: Every day | ORAL | 0 refills | Status: DC

## 2021-11-27 MED ORDER — TRIAMTERENE-HCTZ 37.5-25 MG PO CAPS: 1.0000 | ORAL_CAPSULE | Freq: Every day | ORAL | 0 refills | Status: DC

## 2021-11-27 NOTE — Telephone Encounter (Signed)
Patient came to clinic weight 180lbs on glass scale.  BP  127/89 sitting left arm normal cuff HR 74  Patient reported GI upset symptoms have resolved feeling much better.  She will contact PCM office for new Rx.  She is also running low on farxiga and has contacted her pharmacy 06 Nov 2021 and still awaiting new Rx to refill.  Encouraged patient to follow up with PCM again today so she does not run out of medication.  Patient verbalized understanding information/instructions, agreed with plan of care and had no further questions at this time.  Patient given 90 day supply of her triamterene hydrochlorothiazide and simvastatin from [facility] today bridge refill.

## 2021-11-27 NOTE — Telephone Encounter (Signed)
Patient requested [facility] fills triamterene hydrochlorothiazide 37.54m/25mg po daily #90 RF0 and simvastatin 282mpo daily #90 RF0  [facility] each #30 RF2.  [facility].  Labs stable Patient verbalized understanding information/instructions, agreed with plan of care and had no further questions at this time.  RN Stone notified to obtain BP and weight on patient when picks up medication.   Latest Reference Range & Units 10/06/21 09:50  Sodium 134 - 144 mmol/L 142  Potassium 3.5 - 5.2 mmol/L 4.3  Chloride 96 - 106 mmol/L 104  Glucose 70 - 99 mg/dL 174 (H)  BUN mg/dL 40  Creatinine mg/dL 1.41  Calcium mg/dL 9.9  BUN/Creatinine Ratio  28  eGFR >59 mL/min/1.73 41 (L)  Phosphorus mg/dL 3.6  Alkaline Phosphatase 44 - 121 IU/L 83  Albumin 3.8 - 4.8 g/dL 3.8  Albumin/Globulin Ratio 1.2 - 2.2  1.1 (L)  Uric Acid mg/dL 9.5  AST 0 - 40 IU/L 17  ALT IU/L 15  Total Protein 6.0 - 8.5 g/dL 7.4  Total Bilirubin 0.0 - 1.2 mg/dL 0.2  GGT IU/L 14  Estimated CHD Risk times avg. Comment  LDH IU/L 212  Total CHOL/HDL Ratio ratio 4.3  Cholesterol, Total mg/dL 173  HDL Cholesterol >39 mg/dL 40  Triglycerides mg/dL 198  VLDL Cholesterol Cal 5 - 40 mg/dL 34  LDL Chol Calc (NIH) mg/dL 99  Iron ug/dL 84  Vitamin D, 25-Hydroxy 30.0 - 100.0 ng/mL 25.7 (L)  Globulin, Total 1.5 - 4.5 g/dL 3.6  WBC 3.4 - 10.8 x10E3/uL 4.7  RBC x10E6/uL 4.68  Hemoglobin g/dL 14.9  HCT % 43.8  MCV fL 94  MCH pg 31.8  MCHC g/dL 34.0  RDW % 12.9  Platelets 150 - 450 x10E3/uL 297  Neutrophils Not Estab. % 42  Immature Granulocytes Not Estab. % 0  NEUT# x10E3/uL 2.0  Lymphocyte # x10E3/uL 2.2  Monocytes Absolute x10E3/uL 0.4  Basophils Absolute x10E3/uL 0.0  Immature Grans (Abs) x10E3/uL 0.0  Lymphs Not Estab. % 47  Monocytes Not Estab. % 8  Basos Not Estab. % 1  Eos Not Estab. % 2  EOS (ABSOLUTE) x10E3/uL 0.1   Hemoglobin A1C 4.8 - 5.6 % 8.2 (H)  TSH 0.450 - 4.500 uIU/mL 1.540  Thyroxine (T4) 4.5 - 12.0 ug/dL 8.4  Free Thyroxine Index 1.2 - 4.9  2.2  T3 Uptake Ratio % 26  (H): Data is abnormally high (L): Data is abnormally low

## 2021-12-29 ENCOUNTER — Ambulatory Visit
Admission: EM | Admit: 2021-12-29 | Discharge: 2021-12-29 | Disposition: A | Discharge status: Home / Self Care | Payer: No Typology Code available for payment source | Attending: Internal Medicine | Admitting: Internal Medicine

## 2021-12-29 DIAGNOSIS — M79672 Pain in left foot: Secondary | ICD-10-CM

## 2021-12-29 MED ORDER — PREDNISONE 20 MG PO TABS: 20.0000 mg | ORAL_TABLET | Freq: Every day | ORAL | 0 refills | Status: AC

## 2021-12-29 MED ORDER — COLCHICINE 0.6 MG PO TABS: 1.2000 mg | ORAL_TABLET | Freq: Every day | ORAL | 0 refills | Status: DC

## 2021-12-29 NOTE — Discharge Instructions (Signed)
I am treating you with prednisone and colchicine to decrease inflammation and help alleviate your foot pain.  Please follow-up if symptoms persist or worsen.

## 2021-12-29 NOTE — ED Provider Notes (Signed)
[facility]    CSN: [id number] Arrival date & time: 12/29/21  [id number]      History   Chief Complaint Chief Complaint  Patient presents with   left foot red    HPI Vanessa Sandoval is a 66 y.o. female.   Patient presents with left foot pain that started about 4 to 5 days ago.  Denies any obvious injury to the area.  Patient reports pain is present on the dorsal surface of foot.  Also has associated redness.  She has not taken any medications for pain.  Patient is followed by podiatry but states that she is not able to get an appointment at this time.  Denies any numbness or tingling.  Patient reports that she has been treated for gout in the past but has also been told that she does not have gout.     Past Medical History:  Diagnosis Date   Chronic kidney disease    Diabetes mellitus without complication ([place])    Herpes    Hyperlipemia    Hypertension     Patient Active Problem List   Diagnosis Date Noted   Vitamin D deficiency 07/15/2021   Hyperlipidemia associated with type 2 diabetes mellitus ([place]) 07/15/2021   Essential hypertension, benign 11/14/2019   Type 2 diabetes mellitus with chronic kidney disease, with long-term current use of insulin ([place]) 02/07/2013    Past Surgical History:  Procedure Laterality Date   CESAREAN SECTION     CHOLECYSTECTOMY      OB History   No obstetric history on file.      Home Medications    Prior to Admission medications   Medication Sig Start Date End Date Taking? Authorizing Provider  colchicine 0.6 MG tablet Take 2 tablets (1.2 mg total) by mouth daily. Take 2 tablets now.  Take 1 tablet 1 hour after. 12/29/21  Yes Rexton Greulich, Hildred Alamin E, FNP  predniSONE (DELTASONE) 20 MG tablet Take 1 tablet (20 mg total) by mouth daily for 5 days. 12/29/21 01/03/22 Yes Edilson Vital, Michele Rockers, FNP  BD PEN NEEDLE NANO 2ND GEN 32G X 4 MM MISC Inject 1 Product into the skin Nightly. 08/21/21   Betancourt, Aura Fey, NP  FARXIGA 10 MG TABS tablet Take 10  mg by mouth daily. 09/26/19   [provider]  fluticasone (FLONASE) 50 MCG/ACT nasal spray Place 1 spray into both nostrils 2 (two) times daily. 10/25/19 07/15/21  Betancourt, Aura Fey, NP  glucose blood (CONTOUR TEST) test strip Use as instructed 07/29/20   Betancourt, Aura Fey, NP  Lancets (FREESTYLE) lancets Use as instructed 07/29/20   Betancourt, Aura Fey, NP  LANTUS SOLOSTAR 100 UNIT/ML Solostar Pen Inject 60 Units into the skin daily after supper. 06/09/21   Loni Beckwith, PA-C  meloxicam (MOBIC) 15 MG tablet Take 15 mg by mouth daily as needed for pain.    [provider]  mineral oil-hydrophilic petrolatum (AQUAPHOR) ointment Apply topically as needed for dry skin or irritation. Patient not taking: Reported on 06/08/2021 11/14/19   Betancourt, Otila Kluver A, NP  OZEMPIC, 0.25 OR 0.5 MG/DOSE, 2 MG/3ML SOPN Inject 0.25 mg into the skin once a week. 07/11/21   [provider]  simvastatin (ZOCOR) 20 MG tablet Take 1 tablet (20 mg total) by mouth at bedtime. 11/27/21 02/25/22  Betancourt, Aura Fey, NP  sodium chloride (OCEAN) 0.65 % SOLN nasal spray Place 2 sprays into both nostrils every 2 (two) hours while awake. 10/25/19 07/15/21  Betancourt, Aura Fey, NP  triamterene (DYRENIUM) 50 MG capsule Take 25 mg by mouth 2 (two) times daily. Patient not taking: Reported on 06/08/2021    [provider]  triamterene-hydrochlorothiazide (DYAZIDE) 37.5-25 MG capsule Take 1 each (1 capsule total) by mouth daily. 11/27/21 02/25/22  Betancourt, Aura Fey, NP    Family History Family History  Problem Relation Age of Onset   Diabetes Mother    Hyperlipidemia Mother    Diabetes Father    Heart disease Father    Parkinson's disease Father    Diabetes Brother    Breast cancer Neg Hx     Social History Social History   Tobacco Use   Smoking status: Never    Passive exposure: Yes   Smokeless tobacco: Never  Substance Use Topics   Alcohol use: No   Drug use: No     Allergies    Patient has no known allergies.   Review of Systems Review of Systems Per HPI  Physical Exam Triage Vital Signs ED Triage Vitals [12/29/21 0857]  Enc Vitals Group     BP (!) 141/83     Pulse Rate 82     Resp 16     Temp 98 F (36.7 C)     Temp Source Oral     SpO2 98 %     Weight      Height      Head Circumference      Peak Flow      Pain Score 4     Pain Loc      Pain Edu?      Excl. in [place]?    No data found.  Updated Vital Signs BP (!) 141/83 (BP Location: Left Arm)   Pulse 82   Temp 98 F (36.7 C) (Oral)   Resp 16   SpO2 98%   Visual Acuity Right Eye Distance:   Left Eye Distance:   Bilateral Distance:    Right Eye Near:   Left Eye Near:    Bilateral Near:     Physical Exam Constitutional:      General: She is not in acute distress.    Appearance: Normal appearance. She is not toxic-appearing or diaphoretic.  HENT:     Head: Normocephalic and atraumatic.  Eyes:     Extraocular Movements: Extraocular movements intact.     Conjunctiva/sclera: Conjunctivae normal.  Pulmonary:     Effort: Pulmonary effort is normal.  Feet:     Comments: Area of swelling and redness that is approximately 2 inches x 1 inch to dorsal surface of foot overlying mid fourth metatarsal.  Patient can wiggle toes and has full range of motion of ankle.  Tenderness to palpation to this area as well.  No obvious warmth noted.  Capillary refill and pulses intact. Neurological:     General: No focal deficit present.     Mental Status: She is alert and oriented to person, place, and time. Mental status is at baseline.  Psychiatric:        Mood and Affect: Mood normal.        Behavior: Behavior normal.        Thought Content: Thought content normal.        Judgment: Judgment normal.      UC Treatments / Results  Labs (all labs ordered are listed, but only abnormal results are displayed) Labs Reviewed - No data to display  EKG   Radiology No results  found.  Procedures Procedures (including critical care time)  Medications Ordered in UC Medications - No data to display  Initial Impression / Assessment and Plan / UC Course  I have reviewed the triage vital signs and the nursing notes.  Pertinent labs & imaging results that were available during my care of the patient were reviewed by me and considered in my medical decision making (see chart for details).     Given no obvious injury, will defer imaging.  Patient's physical exam is consistent with possible gout, although patient reports that she has never been confirmed to have gout before.  After further review of patient's chart, patient has had increased uric acid levels in the past and has been treated for gout.  No concern for septic joint or cellulitis given physical exam.  I do think patient would benefit from decreasing inflammation and possibly being treated with colchicine.  Patient has taken colchicine in the past and tolerated well. Will prescribe colchicine for patient.  Last creatinine clearance is 52 so no dosage adjustment is necessary.  Limited options for decreasing inflammation given that patient has CKD and diabetes.  Will avoid NSAIDs.  I do think patient would benefit from prednisone.  Patient does have a glucose monitor at home and patient was advised to monitor blood glucose while taking prednisone.  Will do a short course and low dose of prednisone.  Patient advised to stop prednisone if blood glucose is elevated while taking it.  Patient was advised to follow-up with podiatry for further evaluation and management if symptoms persist or worsen.  Patient verbalized understanding and was agreeable with plan. Final Clinical Impressions(s) / UC Diagnoses   Final diagnoses:  Left foot pain     Discharge Instructions      I am treating you with prednisone and colchicine to decrease inflammation and help alleviate your foot pain.  Please follow-up if symptoms persist or  worsen.    ED Prescriptions     Medication Sig Dispense Auth. Provider   colchicine 0.6 MG tablet Take 2 tablets (1.2 mg total) by mouth daily. Take 2 tablets now.  Take 1 tablet 1 hour after. 3 tablet [place], [place] E, [place]   predniSONE (DELTASONE) 20 MG tablet Take 1 tablet (20 mg total) by mouth daily for 5 days. 5 tablet [place], Michele Rockers, [place]      PDMP not reviewed this encounter.   Teodora Medici, [place] 12/29/21 1028

## 2021-12-29 NOTE — ED Triage Notes (Signed)
Pt c/o left foot redness, edema since ~ Thursday. Causing pain waking pt in the night. Denies known trauma/injury states she just woke up and it was hurting.

## 2022-02-02 ENCOUNTER — Telehealth: Payer: Self-pay | Admitting: Registered Nurse

## 2022-02-02 ENCOUNTER — Encounter: Payer: Self-pay | Admitting: Registered Nurse

## 2022-02-02 DIAGNOSIS — E559 Vitamin D deficiency, unspecified: Secondary | ICD-10-CM

## 2022-02-02 DIAGNOSIS — E1122 Type 2 diabetes mellitus with diabetic chronic kidney disease: Secondary | ICD-10-CM

## 2022-04-23 ENCOUNTER — Encounter: Payer: Self-pay | Admitting: Registered Nurse

## 2022-04-23 ENCOUNTER — Ambulatory Visit: Payer: Self-pay | Admitting: Registered Nurse

## 2022-04-23 VITALS — BP 122/101 | HR 87

## 2022-04-23 DIAGNOSIS — L853 Xerosis cutis: Secondary | ICD-10-CM

## 2022-04-23 DIAGNOSIS — M79671 Pain in right foot: Secondary | ICD-10-CM

## 2022-04-23 MED ORDER — AQUAPHOR EX OINT: TOPICAL_OINTMENT | CUTANEOUS | 0 refills | Status: AC | PRN

## 2022-04-23 NOTE — Progress Notes (Signed)
Subjective:    Patient ID: Vanessa Sandoval, female    DOB: October 24, 1955, 67 y.o.   MRN: [id number]  66y/o established caucasian female patient here for evaluation right ankle/foot pain with ambulation.  Denied swelling/bruise/rash/injury known.  Has diabetic neuropathy bilateral feet.  Wears same shoes sneakers daily.  Applies moisturizer to feet with help from children.  Denied pain when sitting/not moving feet.      Review of Systems  Constitutional:  Negative for chills, diaphoresis and fever.  Respiratory:  Negative for chest tightness, shortness of breath and wheezing.   Cardiovascular:  Negative for chest pain and leg swelling.  Gastrointestinal:  Negative for diarrhea, nausea and vomiting.  Musculoskeletal:  Positive for gait problem and myalgias.  Skin:  Negative for color change, pallor, rash and wound.  Neurological:  Positive for numbness. Negative for tremors and weakness.  Psychiatric/Behavioral:  Negative for agitation, confusion and sleep disturbance.        Objective:   Physical Exam Vitals and nursing note reviewed.  Constitutional:      General: She is awake. She is not in acute distress.    Appearance: Normal appearance. She is well-developed and well-groomed. She is obese. She is not ill-appearing, toxic-appearing or diaphoretic.  HENT:     Head: Normocephalic and atraumatic.     Jaw: There is normal jaw occlusion.     Salivary Glands: Right salivary gland is not diffusely enlarged. Left salivary gland is not diffusely enlarged.     Right Ear: Hearing and external ear normal.     Left Ear: Hearing and external ear normal.     Nose: Nose normal. No congestion or rhinorrhea.     Mouth/Throat:     Lips: Pink. No lesions.     Mouth: Mucous membranes are moist.     Pharynx: Oropharynx is clear.  Eyes:     General: Lids are normal. Vision grossly intact. Gaze aligned appropriately. No scleral icterus.       Right eye: No discharge.        Left eye: No discharge.      Extraocular Movements: Extraocular movements intact.     Conjunctiva/sclera: Conjunctivae normal.     Pupils: Pupils are equal, round, and reactive to light.  Neck:     Trachea: Trachea normal.  Cardiovascular:     Rate and Rhythm: Normal rate and regular rhythm.     Pulses: Normal pulses.          Radial pulses are 2+ on the right side and 2+ on the left side.       Dorsalis pedis pulses are 2+ on the right side and 2+ on the left side.  Pulmonary:     Effort: Pulmonary effort is normal.     Breath sounds: Normal breath sounds and air entry. No stridor or transmitted upper airway sounds. No wheezing.     Comments: Spoke full sentences without difficulty; no cough observed in exam room Abdominal:     Palpations: Abdomen is soft.  Musculoskeletal:        General: Tenderness present. No swelling, deformity or signs of injury. Normal range of motion.     Cervical back: Normal range of motion and neck supple. No rigidity.     Right lower leg: No swelling. No edema.     Left lower leg: No swelling. No edema.     Right ankle: No swelling, deformity, ecchymosis or lacerations. No tenderness. Normal range of motion.     Right  Achilles Tendon: No tenderness or defects.     Left ankle: No swelling, deformity, ecchymosis or lacerations. No tenderness. Normal range of motion.     Left Achilles Tendon: No tenderness or defects.     Right foot: Normal range of motion and normal capillary refill. Tenderness present. No swelling, deformity, bunion, Charcot foot, foot drop, laceration, bony tenderness or crepitus.     Left foot: Normal range of motion and normal capillary refill. No swelling, deformity, bunion, Charcot foot, foot drop, laceration, tenderness, bony tenderness or crepitus.       Feet:  Feet:     Right foot:     Skin integrity: Callus, dry skin and fissure present. No ulcer, blister, erythema or warmth.     Toenail Condition: Right toenails are normal.     Left foot:     Skin  integrity: Callus, dry skin and fissure present. No ulcer, blister, erythema or warmth.     Toenail Condition: Left toenails are normal.     Comments: TTP over veins distal to medial malleolus; no increased temperature/edema/crepitus/induration; no bony TTP; achilles tendons bilateral normal without tenderness; calloused dry skin entire plantar foot and lateral edges/moccasin pattern; wearing cotton ankle socks and leather sneakers today Lymphadenopathy:     Head:     Right side of head: No submandibular or preauricular adenopathy.     Left side of head: No submandibular or preauricular adenopathy.     Cervical:     Right cervical: No superficial cervical adenopathy.    Left cervical: No superficial cervical adenopathy.  Skin:    General: Skin is warm and dry.     Capillary Refill: Capillary refill takes less than 2 seconds.     Coloration: Skin is not ashen, cyanotic, jaundiced, mottled, pale or sallow.     Findings: Rash present. No abrasion, abscess, acne, bruising, burn, ecchymosis, erythema, signs of injury, laceration, lesion, petechiae or wound. Rash is scaling. Rash is not crusting, macular, nodular, papular, purpuric, pustular, urticarial or vesicular.     Nails: There is no clubbing.  Neurological:     General: No focal deficit present.     Mental Status: She is alert and oriented to person, place, and time. Mental status is at baseline.     GCS: GCS eye subscore is 4. GCS verbal subscore is 5. GCS motor subscore is 6.     Cranial Nerves: No cranial nerve deficit, dysarthria or facial asymmetry.     Motor: Motor function is intact. No weakness, tremor, atrophy, abnormal muscle tone or seizure activity.     Coordination: Coordination is intact. Coordination normal.     Gait: Gait is intact. Gait normal.     Comments: In/out of chair and on/off exam table without difficulty; gait sure and steady in clinic; bilateral hand grasp equal 5/5  Psychiatric:        Attention and Perception:  Attention and perception normal.        Mood and Affect: Mood and affect normal.        Speech: Speech normal.        Behavior: Behavior normal. Behavior is cooperative.        Thought Content: Thought content normal.        Cognition and Memory: Cognition and memory normal.        Judgment: Judgment normal.      Applied 3 UD aquaphor ointment to bilateral feet before patient replaced socks and shoes; discussed application after bathing avoid hot/steamy baths/showers  consider emollient application to body BID as dry flaky skin legs/feet.  Increase water intake.  Consider second pair of shoes so she can rotate shoes and allow them to dry out and midsole to expand completely between wearings.  Patient verbalized understanding information/instructions, agreed with plan of care and had no further questions at this time.     Assessment & Plan:   A-acute right foot pain, dry skin  P-discussed most tender over veins medial foot distal to ankle.  No knots/increased temperature most likely irritated varicose veins as distended x 3 adjacent to each other.  Consider compression socks/ice/elevating feet.  If erythema/swelling/bruising occurs to notify NP/clinic staff. Moderately TTP on exam but without guarding during emollient application bilateral feet after exam.  No rash/increased temperature today/bruising/swelling compared to left foot.   Patient reported neuropathy in feet pronounced today also. Exitcare handouts foot pain and varicose veins.  Patient may use mobic she has at home daily for pain.  May use cryotherapy 15 minutes QID prn pain also.  Patient verbalized understanding information/instructions, agreed with plan of care and had no further questions at this time.  Applied 3 UD aquaphor ointment to bilateral feet before patient replaced socks and shoes; discussed application after bathing avoid hot/steamy baths/showers consider emollient application to body BID as dry flaky skin legs/feet.   Increase water intake.  Consider second pair of shoes so she can rotate shoes and allow them to dry out and midsole to expand completely between wearings.  Patient verbalized understanding information/instructions, agreed with plan of care and had no further questions at this time.

## 2022-04-23 NOTE — Patient Instructions (Signed)
Foot Pain Many things can cause foot pain. Some common causes are: An injury. A sprain. Arthritis. Blisters. Bunions. Follow these instructions at home: Managing pain, stiffness, and swelling If directed, put ice on the painful area: Put ice in a plastic bag. Place a towel between your skin and the bag. Leave the ice on for 20 minutes, 2-3 times a day.  Activity Do not stand or walk for long periods. Return to your normal activities as told by your health care provider. Ask your health care provider what activities are safe for you. Do stretches to relieve foot pain and stiffness as told by your health care provider. Do not lift anything that is heavier than 10 lb (4.5 kg), or the limit that you are told, until your health care provider says that it is safe. Lifting a lot of weight can put added pressure on your feet. Lifestyle Wear comfortable, supportive shoes that fit you well. Do not wear high heels. Keep your feet clean and dry. General instructions Take over-the-counter and prescription medicines only as told by your health care provider. Rub your foot gently. Pay attention to any changes in your symptoms. Keep all follow-up visits as told by your health care provider. This is important. Contact a health care provider if: Your pain does not get better after a few days of self-care. Your pain gets worse. You cannot stand on your foot. Get help right away if: Your foot is numb or tingling. Your foot or toes are swollen. Your foot or toes turn white or blue. You have warmth and redness along your foot. Summary Common causes of foot pain are injury, sprain, arthritis, blisters, or bunions. Ice, medicines, and comfortable shoes may help foot pain. Contact your health care provider if your pain does not get better after a few days of self-care. This information is not intended to replace advice given to you by your health care provider. Make sure you discuss any questions you  have with your health care provider. Document Revised: 06/19/2020 Document Reviewed: 06/19/2020 Elsevier Patient Education  [place]. Varicose Veins Varicose veins are veins that have become enlarged, bulged, and twisted. They most often appear in the legs. What are the causes? This condition is caused by damage to the valves in the vein. These valves help blood return to your heart. When they are damaged and they stop working properly, blood may flow backward and back up in the veins near the skin, causing the veins to get larger and appear twisted. The condition can result from any issue that causes blood to back up, like pregnancy, prolonged standing, or obesity. What increases the risk? The following factors may make you more likely to develop this condition: Being on your feet a lot. Being pregnant. Being overweight. Smoking. Having had a previous deep vein thrombosis or having a thrombotic disorder. Aging. The risk increases with age. Having a condition called Klippel-Trenaunay syndrome. What are the signs or symptoms? Symptoms of this condition include: Bulging, twisted, and bluish veins. A feeling of heaviness in your legs. This may be worse at the end of the day. Leg pain. This may be worse at the end of the day. Swelling in the leg. Changes in skin color over the veins. Swelling or pain in the legs can limit your activities. Your symptoms may get worse when you sit or stand for long periods of time. How is this diagnosed? This condition may be diagnosed based on: Your symptoms, family history, activity levels,  and lifestyle. A physical exam. You may also have tests, including an ultrasound or X-ray. How is this treated? Treatment for this condition may involve: Avoiding sitting or standing in one position for long periods of time. Wearing compression stockings. These stockings help to prevent blood clots and reduce swelling in the legs. Raising (elevating) the  legs when resting. Losing weight. Exercising regularly. If you have persistent symptoms or want to improve the way your varicose veins look, you may choose to have a procedure to close the varicose veins off or to remove them. Nonsurgical treatments to close off the veins include: Sclerotherapy. In this treatment, a solution is injected into a vein to close it off. Laser treatment. The vein is heated with a laser to close it off. Radiofrequency vein ablation. An electrical current produced by radio waves is used to close off the vein. Surgical treatments to remove the veins include: Phlebectomy. In this procedure, the veins are removed through small incisions made over the veins. Vein ligation and stripping. In this procedure, incisions are made over the veins. The veins are then removed after being tied (ligated) with stitches (sutures). Follow these instructions at home: Medicines Take over-the-counter and prescription medicines only as told by your health care provider. If you were prescribed an antibiotic medicine, use it as told by your health care provider. Do not stop using the antibiotic even if you start to feel better. Activity Walk as much as possible. Walking increases blood flow. This helps blood return to the heart and takes pressure off your veins. Do not stand or sit in one position for a long period of time. Do not sit with your legs crossed. Avoid sitting for a long time without moving. Get up to take short walks every 1-2 hours. This is important to improve blood flow and breathing. Ask for help if you feel weak or unsteady. Return to your normal activities as told by your health care provider. Ask your health care provider what activities are safe for you. Do exercises as told by your health care provider. General instructions  Follow any diet instructions given to you by your health care provider. Elevate your legs at night to above the level of your heart. If you get a  cut in the skin over the varicose vein and the vein bleeds: Lie down with your leg raised. Apply firm pressure to the cut with a clean cloth until the bleeding stops. Place a bandage (dressing) on the cut. Drink enough fluid to keep your urine pale yellow. Do not use any products that contain nicotine or tobacco. These products include cigarettes, chewing tobacco, and vaping devices, such as e-cigarettes. If you need help quitting, ask your health care provider. Wear compression stockings as told by your health care provider. Do not wear other kinds of tight clothing around your legs, pelvis, or waist. Keep all follow-up visits. This is important. Contact a health care provider if: The skin around your varicose veins starts to break down. You have more pain, redness, tenderness, or hard swelling over a vein. You are uncomfortable because of pain. You get a cut in the skin over a varicose vein and it will not stop bleeding. Get help right away if: You have chest pain. You have trouble breathing. You have severe leg pain. Summary Varicose veins are veins that have become enlarged, bulged, and twisted. They most often appear in the legs. This condition is caused by damage to the valves in the vein. These valves  help blood return to your heart. Treatment for this condition includes frequent movements, wearing compression stockings, losing weight, and exercising regularly. In some cases, procedures are done to close off or remove the veins. Nonsurgical treatments to close off the veins include sclerotherapy, laser therapy, and radiofrequency vein ablation. This information is not intended to replace advice given to you by your health care provider. Make sure you discuss any questions you have with your health care provider. Document Revised: 08/28/2020 Document Reviewed: 08/28/2020 Elsevier Patient Education  [place].

## 2022-04-30 ENCOUNTER — Ambulatory Visit (INDEPENDENT_AMBULATORY_CARE_PROVIDER_SITE_OTHER): Payer: No Typology Code available for payment source

## 2022-04-30 ENCOUNTER — Ambulatory Visit: Payer: No Typology Code available for payment source | Admitting: Podiatry

## 2022-04-30 DIAGNOSIS — M7751 Other enthesopathy of right foot: Secondary | ICD-10-CM

## 2022-04-30 DIAGNOSIS — M25571 Pain in right ankle and joints of right foot: Secondary | ICD-10-CM

## 2022-04-30 DIAGNOSIS — M76821 Posterior tibial tendinitis, right leg: Secondary | ICD-10-CM

## 2022-04-30 MED ORDER — TRIAMCINOLONE ACETONIDE 10 MG/ML IJ SUSP
20.0000 mg | Freq: Once | INTRAMUSCULAR | Status: AC
  Administered 2022-04-30: 20 mg

## 2022-04-30 MED ORDER — DICLOFENAC SODIUM 75 MG PO TBEC: 75.0000 mg | DELAYED_RELEASE_TABLET | Freq: Two times a day (BID) | ORAL | 2 refills | Status: DC

## 2022-05-04 NOTE — Progress Notes (Signed)
Subjective:   Patient ID: Vanessa Sandoval, female   DOB: 67 y.o.   MRN: [id number]   HPI Patient presents with pain on the inside of the right ankle and also pain around the second big toe joint right with inflammation.  States both of them hurt her and she does not remember injury   ROS      Objective:  Physical Exam  Neuro vas scaler status intact with flatfoot deformity noted with inflammation pain posterior tibial tendon as it comes under the medial malleolus with no indication of rupture of the tendon.  Inflammation pain around the second MPJ right is also noted with good digital perfusion well-oriented x 3     Assessment:  Inflammatory posterior tibial tendinitis right along with inflammatory capsulitis second MPJ     Plan:  H&P reviewed both conditions and went ahead today did sterile prep and sheath injection right 3 mg dexamethasone Kenalog 5 mg Xylocaine and it went ahead did sterile prep and injected around the second MPJ right periarticular 3 mg dexamethasone Kenalog 5 mg Xylocaine.  Instructed on reduced activity applied fascial brace properly fitted into the arch to lift up the arch and take pressure off the medial side of the foot  X-rays were negative for signs of fracture or bony injury associated with condition

## 2022-06-19 ENCOUNTER — Telehealth: Payer: Self-pay | Admitting: Registered Nurse

## 2022-06-19 DIAGNOSIS — E559 Vitamin D deficiency, unspecified: Secondary | ICD-10-CM

## 2022-06-19 DIAGNOSIS — E1122 Type 2 diabetes mellitus with diabetic chronic kidney disease: Secondary | ICD-10-CM

## 2022-06-19 DIAGNOSIS — Z0000 Encounter for general adult medical examination without abnormal findings: Secondary | ICD-10-CM

## 2022-06-20 MED ORDER — TRIAMTERENE-HCTZ 37.5-25 MG PO CAPS: 1.0000 | ORAL_CAPSULE | Freq: Every day | ORAL | 0 refills | Status: AC

## 2022-06-20 MED ORDER — SIMVASTATIN 20 MG PO TABS: 20.0000 mg | ORAL_TABLET | Freq: Every day | ORAL | 0 refills | Status: AC

## 2022-06-20 NOTE — Telephone Encounter (Signed)
Patient reported will be retiring in April 2024 and plans to follow up with [facility] for labs.  Patient would like refill triamterene/hydrochlorothiazide and simvastatin from [facility].  #90 RF0 Rx entered for patient and notified her will be filled on Tuesday for her to pick up from [facility].  Discussed may also have nonfasting vitamin D and Hgba1c drawn by RN Evlyn Kanner next week also or wait to have performed at [facility].  Patient verbalized understanding information/instructions and had no further questions at this time.

## 2022-06-23 NOTE — Telephone Encounter (Signed)
Patient scheduling labs with [facility].  Retiring next week and will not be eligible for care at [facility] at that time.  Requested [facility] fill triamterene hydrochlorothiazide 37.5/25mg  po daily #90 RF0 and simvastatin 20mg  po daily #90 RF0 dispensed from [facility] to patient  Last fill 11/27/21 per chart review 90 tabs each.  Patient agreed with plan of care and had no further questions at this time.  RN [employer] notified.

## 2022-06-27 NOTE — Telephone Encounter (Signed)
Medication filled for patient 06/23/22 and she will schedule follow up with [facility] for labs/Rx renewal to [employer].  Wished her well in retirement planning vacation to beach with family week after retirement.  A&Ox3 spoke full sentences without difficulty gait sure and steady in warehouse respirations even and unlabored RA skin warm dry and pink.

## 2022-09-13 NOTE — Progress Notes (Signed)
Noted follow up labs have been completed 

## 2022-09-17 ENCOUNTER — Other Ambulatory Visit: Payer: Self-pay | Admitting: Family Medicine

## 2022-09-17 ENCOUNTER — Ambulatory Visit
Admission: RE | Admit: 2022-09-17 | Discharge: 2022-09-17 | Disposition: A | Discharge status: Home / Self Care | Payer: Medicare HMO | Source: Ambulatory Visit | Attending: Family Medicine | Admitting: Family Medicine

## 2022-09-17 DIAGNOSIS — M25521 Pain in right elbow: Secondary | ICD-10-CM

## 2023-08-24 ENCOUNTER — Ambulatory Visit: Admission: EM | Admit: 2023-08-24 | Discharge: 2023-08-24 | Disposition: A | Discharge status: Home / Self Care

## 2023-08-24 DIAGNOSIS — M109 Gout, unspecified: Secondary | ICD-10-CM | POA: Diagnosis not present

## 2023-08-24 MED ORDER — COLCHICINE 0.6 MG PO TABS: ORAL_TABLET | ORAL | 0 refills | Status: AC

## 2023-08-24 NOTE — ED Provider Notes (Signed)
 [facility]    CSN: [id number] Arrival date & time: 08/24/23  1618      History   Chief Complaint Chief Complaint  Patient presents with   Hand Problem    HPI Vanessa Sandoval is a 68 y.o. female.   Patient presents today with a 2-day history of right third MCP joint swelling and pain.  She reports that yesterday she felt a stiffness and had decreased grip strength but then today she woke up and had pain.  She reports minimal pain at rest (2 on a 0-10 pain scale) but increases to 6/7 with flexion of the joint.  She denies any known injury or change in activity.  She has been diagnosed with gout in the past but has not had a flare recently.  She does not take any uric acid lowering medication.  She does report that in the past several weeks she has had a few dietary indiscretions including liver pudding as well as shrimp and wonders if this could have triggered her symptoms.  She has not been taking any over-the-counter medication for symptom management.  She reports that her kidneys are functioning fine but she has a history of chronic kidney disease listed on her chart.  She reports having blood work a few months ago but unfortunately this is not visible in EMR.  She denies any fever, nausea, vomiting.  Denies any history of recurrent skin infections including cellulitis.  She is right-handed.  Denies any numbness or paresthesias in the hand.    Past Medical History:  Diagnosis Date   Chronic kidney disease    Diabetes mellitus without complication (HCC)    Herpes    Hyperlipemia    Hypertension     Patient Active Problem List   Diagnosis Date Noted   Vitamin D  deficiency 07/15/2021   Hyperlipidemia associated with type 2 diabetes mellitus (HCC) 07/15/2021   Essential hypertension, benign 11/14/2019   Type 2 diabetes mellitus with chronic kidney disease, with long-term current use of insulin  (HCC) 02/07/2013    Past Surgical History:  Procedure Laterality Date    CESAREAN SECTION     CHOLECYSTECTOMY      OB History   No obstetric history on file.      Home Medications    Prior to Admission medications   Medication Sig Start Date End Date Taking? Authorizing Provider  amlodipine-atorvastatin (CADUET) 10-20 MG tablet Take 1 tablet by mouth daily. 08/13/23  Yes [provider]  BD PEN NEEDLE NANO 2ND GEN 32G X 4 MM MISC Inject 1 Product into the skin Nightly. 08/21/21  Yes Betancourt, Cleotis Daily, NP  colchicine  0.6 MG tablet Take 1.2 mg (2 tablets) on day 1 and one tablet 0.6 mg days 2-5 08/24/23  Yes Zakyla Tonche K, PA-C  FARXIGA 10 MG TABS tablet Take 10 mg by mouth daily. 09/26/19  Yes [provider]  glucose blood (CONTOUR TEST) test strip Use as instructed 07/29/20  Yes Betancourt, Cleotis Daily, NP  Lancets (FREESTYLE) lancets Use as instructed 07/29/20  Yes Betancourt, Cleotis Daily, NP  LANTUS  SOLOSTAR 100 UNIT/ML Solostar Pen Inject 60 Units into the skin daily after supper. 06/09/21  Yes Marshal Skeens, PA-C  losartan (COZAAR) 50 MG tablet Take 50 mg by mouth daily. 08/12/23  Yes [provider]  meloxicam (MOBIC) 15 MG tablet Take 15 mg by mouth daily as needed for pain.   Yes [provider]  mineral oil-hydrophilic petrolatum (AQUAPHOR) ointment Apply topically as  needed for dry skin or irritation. 04/23/22  Yes Betancourt, Tina A, NP  OZEMPIC, 0.25 OR 0.5 MG/DOSE, 2 MG/3ML SOPN Inject 0.25 mg into the skin once a week. 07/11/21  Yes [provider]  triamterene  (DYRENIUM ) 50 MG capsule Take 25 mg by mouth 2 (two) times daily.   Yes [provider]  fluticasone  (FLONASE ) 50 MCG/ACT nasal spray Place 1 spray into both nostrils 2 (two) times daily. 10/25/19 07/15/21  Betancourt, Cleotis Daily, NP  simvastatin  (ZOCOR ) 20 MG tablet Take 1 tablet (20 mg total) by mouth at bedtime. 06/20/22 09/18/22  Betancourt, Cleotis Daily, NP  sodium chloride  (OCEAN) 0.65 % SOLN nasal spray Place 2 sprays into both nostrils every 2 (two) hours  while awake. 10/25/19 07/15/21  Betancourt, Cleotis Daily, NP  triamterene -hydrochlorothiazide (DYAZIDE) 37.5-25 MG capsule Take 1 each (1 capsule total) by mouth daily. 06/20/22 09/18/22  Betancourt, Cleotis Daily, NP    Family History Family History  Problem Relation Age of Onset   Diabetes Mother    Hyperlipidemia Mother    Diabetes Father    Heart disease Father    Parkinson's disease Father    Diabetes Brother    Breast cancer Neg Hx     Social History Social History   Tobacco Use   Smoking status: Never    Passive exposure: Yes   Smokeless tobacco: Never  Substance Use Topics   Alcohol use: No   Drug use: No     Allergies   Patient has no known allergies.   Review of Systems Review of Systems  Constitutional:  Positive for activity change. Negative for appetite change, fatigue and fever.  Gastrointestinal:  Negative for abdominal pain, diarrhea, nausea and vomiting.  Musculoskeletal:  Positive for arthralgias and joint swelling. Negative for myalgias.  Skin:  Negative for color change and wound.  Neurological:  Negative for weakness and numbness.     Physical Exam Triage Vital Signs ED Triage Vitals  Encounter Vitals Group     BP 08/24/23 1808 132/77     Systolic BP Percentile --      Diastolic BP Percentile --      Pulse Rate 08/24/23 1808 85     Resp 08/24/23 1808 16     Temp 08/24/23 1808 97.8 F (36.6 C)     Temp Source 08/24/23 1808 Oral     SpO2 08/24/23 1808 98 %     Weight 08/24/23 1805 181 lb (82.1 kg)     Height 08/24/23 1805 5\' 4"  (1.626 m)     Head Circumference --      Peak Flow --      Pain Score 08/24/23 1805 2     Pain Loc --      Pain Education --      Exclude from Growth Chart --    No data found.  Updated Vital Signs BP 132/77 (BP Location: Right Arm)   Pulse 85   Temp 97.8 F (36.6 C) (Oral)   Resp 16   Ht 5\' 4"  (1.626 m)   Wt 181 lb (82.1 kg)   SpO2 98%   BMI 31.07 kg/m   Visual Acuity Right Eye Distance:   Left Eye Distance:    Bilateral Distance:    Right Eye Near:   Left Eye Near:    Bilateral Near:     Physical Exam Vitals reviewed.  Constitutional:      General: She is awake. She is not in acute distress.    Appearance:  Normal appearance. She is well-developed. She is not ill-appearing.     Comments: Very pleasant female appears stated age in no acute distress sitting comfortably in exam room  HENT:     Head: Normocephalic and atraumatic.  Cardiovascular:     Rate and Rhythm: Normal rate and regular rhythm.     Heart sounds: Normal heart sounds, S1 normal and S2 normal. No murmur heard.    Comments: Capillary fill with 2 seconds right fingers. Pulmonary:     Effort: Pulmonary effort is normal.     Breath sounds: Normal breath sounds. No wheezing, rhonchi or rales.     Comments: Clear to auscultation Musculoskeletal:     Right hand: Swelling and tenderness present. No bony tenderness. Decreased range of motion. There is no disruption of two-point discrimination. Normal capillary refill.     Comments: Right hand: Tenderness palpation with associated swelling noted third MCP joint.  No deformity noted.  Decreased grip strength secondary to pain.  No wounds or erythema noted.  Hand is neurovascularly intact based on 2-point discrimination.  Psychiatric:        Behavior: Behavior is cooperative.      UC Treatments / Results  Labs (all labs ordered are listed, but only abnormal results are displayed) Labs Reviewed - No data to display  EKG   Radiology No results found.  Procedures Procedures (including critical care time)  Medications Ordered in UC Medications - No data to display  Initial Impression / Assessment and Plan / UC Course  I have reviewed the triage vital signs and the nursing notes.  Pertinent labs & imaging results that were available during my care of the patient were reviewed by me and considered in my medical decision making (see chart for details).     Patient is  well-appearing, afebrile, nontoxic, nontachycardic.  Low suspicion for cellulitis or infectious etiology given clinical presentation.  Concern for gout flare by dietary indiscretion.  Given her history of chronic kidney disease we will treat with colchicine .  No indication for dose adjustment based on metabolic panel from 2023 which is the last 1 that we have available with creatinine of 1.41 and calculated creatinine clearance of 50.18 mL/min.  Discussed that she is to hold her atorvastatin while on this medication for 3 days after completing course.  Recommended that she use Tylenol for additional pain relief.  She is to follow-up with her primary care if her symptoms or not improved.  Plain films were deferred as she denied recent trauma and has no focal bony tenderness.  We discussed that if anything worsens and she has increasing pain, swelling, numbness or paresthesias, fever she needs to be seen immediately.  Final Clinical Impressions(s) / UC Diagnoses   Final diagnoses:  Acute gout of right hand, unspecified cause     Discharge Instructions      We are treating you for a gout flare.  Start colchicine  by taking 2 tablets today and then 1 tablet every day for the next 4 additional days.  Do not take your atorvastatin while on this medication and for 3 days after you finish it.  You can use Tylenol for breakthrough pain.  Follow-up with your primary care if your symptoms are improving quickly with medication.  If anything worsens and you develop fever, increasing pain, swelling, redness, numbness or tingling in your hand you should be seen immediately.   ED Prescriptions     Medication Sig Dispense Auth. Provider   colchicine  0.6 MG  tablet Take 1.2 mg (2 tablets) on day 1 and one tablet 0.6 mg days 2-5 6 tablet Gerik Coberly K, PA-C      PDMP not reviewed this encounter.   Budd Cargo, PA-C 08/24/23 1848

## 2023-08-24 NOTE — Discharge Instructions (Signed)
 We are treating you for a gout flare.  Start colchicine  by taking 2 tablets today and then 1 tablet every day for the next 4 additional days.  Do not take your atorvastatin while on this medication and for 3 days after you finish it.  You can use Tylenol for breakthrough pain.  Follow-up with your primary care if your symptoms are improving quickly with medication.  If anything worsens and you develop fever, increasing pain, swelling, redness, numbness or tingling in your hand you should be seen immediately.

## 2023-08-24 NOTE — ED Triage Notes (Signed)
 Pt states that he right hand is swollen and red. X2 days Pt denies any known injury.
